# Patient Record
Sex: Male | Born: 1975 | Race: White | Marital: Married | Smoking: Former smoker
Health system: Southern US, Community
[De-identification: ages and names within clinical notes are randomized; demographics above are authoritative.]

## PROBLEM LIST (undated history)

## (undated) DIAGNOSIS — R079 Chest pain, unspecified: Secondary | ICD-10-CM

## (undated) DIAGNOSIS — I10 Essential (primary) hypertension: Secondary | ICD-10-CM

## (undated) DIAGNOSIS — E785 Hyperlipidemia, unspecified: Secondary | ICD-10-CM

## (undated) HISTORY — DX: Essential (primary) hypertension: I10

## (undated) HISTORY — DX: Hyperlipidemia, unspecified: E78.5

## (undated) HISTORY — DX: Chest pain, unspecified: R07.9

---
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
## 2009-10-09 HISTORY — PX: NASAL/SINUS ENDOSCOPY: SHX288

## 2014-03-30 ENCOUNTER — Ambulatory Visit
Admission: RE | Admit: 2014-03-30 | Discharge: 2014-03-30 | Disposition: A | Discharge status: Home / Self Care | Payer: BC Managed Care – PPO | Source: Ambulatory Visit | Attending: Family Medicine | Admitting: Family Medicine

## 2014-03-30 ENCOUNTER — Other Ambulatory Visit (HOSPITAL_COMMUNITY): Payer: Self-pay | Admitting: Family Medicine

## 2014-03-30 DIAGNOSIS — M25551 Pain in right hip: Secondary | ICD-10-CM

## 2015-01-11 ENCOUNTER — Telehealth: Payer: Self-pay | Admitting: Cardiovascular Disease

## 2015-01-11 NOTE — Telephone Encounter (Signed)
Received records from [facility] for appointment on 01/29/15.  [facility] for Dr Hazle CocaBerry's schedule on 01/29/15. lp

## 2015-01-29 ENCOUNTER — Encounter: Payer: Self-pay | Admitting: Cardiovascular Disease

## 2015-01-29 ENCOUNTER — Ambulatory Visit (INDEPENDENT_AMBULATORY_CARE_PROVIDER_SITE_OTHER): Payer: BLUE CROSS/BLUE SHIELD | Admitting: Cardiovascular Disease

## 2015-01-29 VITALS — BP 120/74 | HR 73 | Ht 76.0 in | Wt 223.7 lb

## 2015-01-29 DIAGNOSIS — Z79899 Other long term (current) drug therapy: Secondary | ICD-10-CM

## 2015-01-29 DIAGNOSIS — R079 Chest pain, unspecified: Secondary | ICD-10-CM

## 2015-01-29 DIAGNOSIS — E785 Hyperlipidemia, unspecified: Secondary | ICD-10-CM

## 2015-01-29 DIAGNOSIS — E781 Pure hyperglyceridemia: Secondary | ICD-10-CM | POA: Insufficient documentation

## 2015-01-29 DIAGNOSIS — R0789 Other chest pain: Secondary | ICD-10-CM | POA: Insufficient documentation

## 2015-01-29 NOTE — Assessment & Plan Note (Signed)
History of mild hyperlipidemia with his last LDL measured 2 years ago 110. Does have mild hypercholesterolemia part probably related to alcohol use (3 beers a day). Given his family history of heart disease undergoing to get a NMR lipid med lipid profile to further evaluate his particle size and number and therefore cardiovascular risk.

## 2015-01-29 NOTE — Progress Notes (Signed)
     01/29/2015 Marvin Chan   27-Jul-1976  [id number]  Primary Physician Pcp Not In System Primary Cardiologist: Runell GessJonathan J. Berry MD Roseanne RenoFACP,FACC,FAHA, FSCAI   HPI:  Marvin Chan is a delightful 39 year old mildly overweight married Caucasian male father of one 41-year-old daughter who is referred by Dr. Ronal Fearichard Keever Adson Gentile for cardiovascular evaluation because of recent onset of chest pain. 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 His cardiovascular risk factor profile is only notable for mild hyperlipidemia but otherwise is negative. He does have several uncles who have had ischemic heart disease but no first-degree relative. He recently developed atypical chest pain while riding his bicycle. He is an avid bicyclist and has had some chest pain since that time without radiation or associated symptoms.   No current outpatient prescriptions on file.   No current facility-administered medications for this visit.    No Known Allergies  History   Social History  . Marital Status: Married    Spouse Name: N/A  . Number of Children: N/A  . Years of Education: N/A   Occupational History  . Not on file.   Social History Main Topics  . Smoking status: Never Smoker   . Smokeless tobacco: Never Used  . Alcohol Use: Not on file  . Drug Use: Not on file  . Sexual Activity: Not on file   Other Topics Concern  . Not on file   Social History Narrative  . No narrative on file     Review of Systems: General: negative for chills, fever, night sweats or weight changes.  Cardiovascular: negative for chest pain, dyspnea on exertion, edema, orthopnea, palpitations, paroxysmal nocturnal dyspnea or shortness of breath Dermatological: negative for rash Respiratory: negative for cough or wheezing Urologic: negative for hematuria Abdominal: negative for nausea, vomiting, diarrhea, bright red blood per rectum, melena, or hematemesis Neurologic: negative for visual changes, syncope, or dizziness All  other systems reviewed and are otherwise negative except as noted above.    Blood pressure 120/74, pulse 73, height 6\' 4"  (1.93 m), weight 223 lb 11.2 oz (101.47 kg).  General appearance: alert and no distress Neck: no adenopathy, no carotid bruit, no JVD, supple, symmetrical, trachea midline and thyroid not enlarged, symmetric, no tenderness/mass/nodules Lungs: clear to auscultation bilaterally Heart: regular rate and rhythm, S1, S2 normal, no murmur, click, rub or gallop Extremities: extremities normal, atraumatic, no cyanosis or edema and 2+ pedal pulses bilaterally  EKG normal sinus rhythm at 73 without ST or T-wave changes. I personally reviewed this EKG  ASSESSMENT AND PLAN:   Hyperlipidemia History of mild hyperlipidemia with his last LDL measured 2 years ago 110. Does have mild hypercholesterolemia part probably related to alcohol use (3 beers a day). Given his family history of heart disease undergoing to get a NMR lipid med lipid profile to further evaluate his particle size and number and therefore cardiovascular risk.   Chest pain Marvin Chan recently developed some atypical chest pain while riding his bicycle. He is an avid bicyclist and has had some off-and-on chest pain since. He's had uncles with CAD ischemic heart disease. I'm going to get a routine GXT to further evaluate him and risk stratify him       Runell GessJonathan J. Berry MD [facility] HospitalFACP,FACC,FAHA, [facility]ayerFSCAI 01/29/2015 4:00 PM

## 2015-01-29 NOTE — Assessment & Plan Note (Signed)
Marvin Chan recently developed some atypical chest pain while riding his bicycle. He is an avid bicyclist and has had some off-and-on chest pain since. He's had uncles with CAD ischemic heart disease. I'm going to get a routine GXT to further evaluate him and risk stratify him

## 2015-01-29 NOTE — Patient Instructions (Signed)
  We will see you back in follow up after the test.  Dr Allyson SabalBerry has ordered: 1. Your physician has requested that you have an exercise tolerance test. For further information please visit [email or web address]. Please also follow instruction sheet, as given.  2. A FASTING lipid profile: to be done at your convenience.  [facility] is [facility] lab on the first floor of this building, [address].  They are open from 8am-5pm with a lunch from 12-2.  You do not need an appointment.

## 2015-02-03 ENCOUNTER — Other Ambulatory Visit: Payer: Self-pay | Admitting: *Deleted

## 2015-02-03 DIAGNOSIS — R079 Chest pain, unspecified: Secondary | ICD-10-CM

## 2015-02-19 ENCOUNTER — Telehealth (HOSPITAL_COMMUNITY): Payer: Self-pay

## 2015-02-19 NOTE — Telephone Encounter (Signed)
Encounter complete. 

## 2015-02-23 ENCOUNTER — Telehealth (HOSPITAL_COMMUNITY): Payer: Self-pay | Admitting: *Deleted

## 2015-02-23 NOTE — Telephone Encounter (Signed)
Returned patient's call regarding upcoming GXT, instructions given via voice mail. Antionette CharMary J Tahirah Sara, RN

## 2015-02-24 ENCOUNTER — Ambulatory Visit (HOSPITAL_COMMUNITY)
Admission: RE | Admit: 2015-02-24 | Discharge: 2015-02-24 | Disposition: A | Discharge status: Home / Self Care | Payer: BLUE CROSS/BLUE SHIELD | Source: Ambulatory Visit | Attending: Cardiology | Admitting: Cardiology

## 2015-02-24 DIAGNOSIS — R079 Chest pain, unspecified: Secondary | ICD-10-CM | POA: Diagnosis not present

## 2015-02-24 LAB — EXERCISE TOLERANCE TEST
CHL CUP STRESS STAGE 1 GRADE: 0 %
CHL CUP STRESS STAGE 1 SBP: 127 mmHg
CHL CUP STRESS STAGE 1 SPEED: 0 mph
CHL CUP STRESS STAGE 10 DBP: 86 mmHg
CHL CUP STRESS STAGE 10 SBP: 136 mmHg
CHL CUP STRESS STAGE 10 SPEED: 0 mph
CHL CUP STRESS STAGE 2 SPEED: 0.8 mph
CHL CUP STRESS STAGE 3 SPEED: 1 mph
CHL CUP STRESS STAGE 4 GRADE: 10 %
CHL CUP STRESS STAGE 4 HR: 83 {beats}/min
CHL CUP STRESS STAGE 5 DBP: 88 mmHg
CHL CUP STRESS STAGE 5 GRADE: 12 %
CHL CUP STRESS STAGE 5 SBP: 143 mmHg
CHL CUP STRESS STAGE 7 SPEED: 4.1 mph
CHL CUP STRESS STAGE 8 DBP: 40 mmHg
CHL CUP STRESS STAGE 8 SPEED: 5 mph
CHL CUP STRESS STAGE 9 HR: 123 {beats}/min
CHL CUP STRESS STAGE 9 SBP: 169 mmHg
CSEPED: 14 min
CSEPEW: 17.2 METS
Exercise duration (sec): 3 s
MPHR: 180 {beats}/min
Peak BP: 67 mmHg
Peak HR: 176 {beats}/min
Percent HR: 98 %
Percent of predicted max HR: 98 %
RPE: 30272
Rest HR: 63 {beats}/min
Stage 1 DBP: 80 mmHg
Stage 1 HR: 72 {beats}/min
Stage 10 Grade: 0 %
Stage 10 HR: 78 {beats}/min
Stage 2 Grade: 0 %
Stage 2 HR: 72 {beats}/min
Stage 3 Grade: 0.1 %
Stage 3 HR: 72 {beats}/min
Stage 4 DBP: 92 mmHg
Stage 4 SBP: 127 mmHg
Stage 4 Speed: 1.7 mph
Stage 5 HR: 96 {beats}/min
Stage 5 Speed: 2.5 mph
Stage 6 DBP: 82 mmHg
Stage 6 Grade: 14 %
Stage 6 HR: 129 {beats}/min
Stage 6 SBP: 168 mmHg
Stage 6 Speed: 3.4 mph
Stage 7 DBP: 77 mmHg
Stage 7 Grade: 16 %
Stage 7 HR: 160 {beats}/min
Stage 7 SBP: 172 mmHg
Stage 8 Grade: 18 %
Stage 8 HR: 176 {beats}/min
Stage 8 SBP: 67 mmHg
Stage 9 DBP: 70 mmHg
Stage 9 Grade: 0 %
Stage 9 Speed: 0 mph

## 2015-02-25 ENCOUNTER — Telehealth: Payer: Self-pay | Admitting: Cardiovascular Disease

## 2015-02-25 NOTE — Telephone Encounter (Signed)
Patient asking for Xanax so that he won't pass out during blood draw.  Has lipid & liver labs pending draw at his convenience.  Routing to Dr. Allyson SabalBerry to advise.

## 2015-02-25 NOTE — Telephone Encounter (Signed)
Pt called in stating that he is suppose to have some lab work done before coming in for his follow up from his stress test. Problem is , he gets high anxiety about having his blood drawn and then he faints. He would like to know if  he could receive Xanax to get through the blood drawing process. Please call  Thanks

## 2015-02-26 NOTE — Telephone Encounter (Signed)
Called patient to inform that Dr. Allyson SabalBerry would not write for Xanax, no answer on pickup, no VM box.

## 2015-02-26 NOTE — Telephone Encounter (Signed)
No

## 2015-03-17 ENCOUNTER — Ambulatory Visit: Payer: BLUE CROSS/BLUE SHIELD | Admitting: Cardiovascular Disease

## 2015-03-30 ENCOUNTER — Encounter: Payer: Self-pay | Admitting: Cardiovascular Disease

## 2015-04-06 ENCOUNTER — Ambulatory Visit (INDEPENDENT_AMBULATORY_CARE_PROVIDER_SITE_OTHER): Payer: BLUE CROSS/BLUE SHIELD | Admitting: Cardiovascular Disease

## 2015-04-06 ENCOUNTER — Encounter: Payer: Self-pay | Admitting: Cardiovascular Disease

## 2015-04-06 VITALS — BP 136/72 | HR 72 | Ht 75.5 in | Wt 226.6 lb

## 2015-04-06 DIAGNOSIS — E785 Hyperlipidemia, unspecified: Secondary | ICD-10-CM

## 2015-04-06 DIAGNOSIS — R079 Chest pain, unspecified: Secondary | ICD-10-CM

## 2015-04-06 DIAGNOSIS — Z13220 Encounter for screening for lipoid disorders: Secondary | ICD-10-CM

## 2015-04-06 NOTE — Progress Notes (Signed)
Mr. Marvin Chan returns for follow-up. He was seen for dilation of chest pain. His routine GXT was entirely normal. No has pain. LDL has increased from 90-118 last 4 years probably as a result of diet. Talked about dietary modification which she has agreed to a more attention to. We will recheck a lipid liver profile in 3 months I will see back when necessary   Runell GessJonathan J. Jye Fariss, M.D., FACP, [facility]CFACC, Earl LagosFAHA, [facility]SCAI [facility] [address] [place], [place]  [address]  [phone] 04/06/2015 9:56 AM

## 2015-04-06 NOTE — Patient Instructions (Signed)
Your physician recommends that you return for lab work in 3 months - FASTING.  Dr Allyson SabalBerry recommends that you follow-up with him as needed.

## 2015-04-06 NOTE — Assessment & Plan Note (Signed)
Mr. Marvin Chan had a routine treadmill performed last month that was entirely normal. He no longer has chest pain. I reassured him the likelihood of his pain being ischemic in nature is extremely small

## 2015-04-06 NOTE — Assessment & Plan Note (Signed)
Recent lipid profile performed at his place of work revealed total cholesterol of 202, LDL 118, HDL of 50 and triglyceride level of 166. He does amended to drinking beer and eating excess amount of red meat. Talked about dietary modification. I'm going to recheck his lipid profile in approximately 3 months.

## 2017-01-31 LAB — TSH: TSH: 1.55 (ref 0.41–5.90)

## 2017-01-31 LAB — BASIC METABOLIC PANEL
BUN: 11 (ref 4–21)
Creatinine: 0.9 (ref 0.6–1.3)
Glucose: 92
Potassium: 4.1 (ref 3.4–5.3)
Sodium: 137 (ref 137–147)

## 2017-01-31 LAB — HEPATIC FUNCTION PANEL
ALT: 18 (ref 10–40)
AST: 17 (ref 14–40)
Alkaline Phosphatase: 57 (ref 25–125)
BILIRUBIN, TOTAL: 1.1

## 2017-01-31 LAB — CBC AND DIFFERENTIAL
HEMATOCRIT: 43 (ref 41–53)
Hemoglobin: 15.1 (ref 13.5–17.5)
NEUTROS ABS: 3
Platelets: 224 (ref 150–399)
WBC: 4.6

## 2017-02-01 LAB — LIPID PANEL
Cholesterol: 175 (ref 0–200)
HDL: 41 (ref 35–70)
LDL Cholesterol: 100
Triglycerides: 168 — AB (ref 40–160)

## 2017-06-20 ENCOUNTER — Ambulatory Visit (INDEPENDENT_AMBULATORY_CARE_PROVIDER_SITE_OTHER): Payer: BLUE CROSS/BLUE SHIELD

## 2017-06-20 ENCOUNTER — Ambulatory Visit (INDEPENDENT_AMBULATORY_CARE_PROVIDER_SITE_OTHER): Payer: BLUE CROSS/BLUE SHIELD | Admitting: Orthopedic Surgery

## 2017-06-20 ENCOUNTER — Encounter (INDEPENDENT_AMBULATORY_CARE_PROVIDER_SITE_OTHER): Payer: Self-pay | Admitting: Orthopedic Surgery

## 2017-06-20 DIAGNOSIS — M25561 Pain in right knee: Secondary | ICD-10-CM

## 2017-06-23 NOTE — Progress Notes (Signed)
   Office Visit Note   Patient: Marvin Chan           Date of Birth: Sep 17, 1976           MRN: [id number] Visit Date: 06/20/2017 Requested by: No referring provider defined for this encounter. PCP: System, Pcp Not In  Subjective: Chief Complaint  Patient presents with  . Right Knee - Pain    HPI: Marvin Chan is a 41 year old patient with 2 year history of worsening right knee pain.  He is very active.  States that the knee grinds when he is going up stairs.  He has some pain if he goes for long time.  Denies any swelling and does not take any medication for the problem.  Denies any discrete history of injury.              ROS: All systems reviewed are negative as they relate to the chief complaint within the history of present illness.  Patient denies  fevers or chills.   Assessment & Plan: Visit Diagnoses:  1. Acute pain of right knee     Plan: Impression is right knee asymmetric patellofemoral crepitus without an effusion and normal x-rays.  He is not particular symptomatic from this.  Plan at this time would be first course of treatment has he becomes symptomatic anti-inflammatories followed by injection followed by MRI scanning and intervention if indicated.  Currently no the seizure indicated.  I'll see him back as needed  Follow-Up Instructions: Return if symptoms worsen or fail to improve.   Orders:  Orders Placed This Encounter  Procedures  . XR KNEE 3 VIEW RIGHT   No orders of the defined types were placed in this encounter.     Procedures: No procedures performed   Clinical Data: No additional findings.  Objective: Vital Signs: There were no vitals taken for this visit.  Physical Exam:   Constitutional: Patient appears well-developed HEENT:  Head: Normocephalic Eyes:EOM are normal Neck: Normal range of motion Cardiovascular: Normal rate Pulmonary/chest: Effort normal Neurologic: Patient is alert Skin: Skin is warm Psychiatric: Patient has normal mood and  affect    Ortho Exam: Orthopedic exam demonstrates full active and passive range of motion of the right knee he does have more patellofemoral crepitus on the right than the left but it is not really associated with any periretinacular tenderness.  There is no effusion in the right knee collateral crucial ligaments are stable.  No joint line tenderness is present.  Range of motion is full.  No groin pain with internal/external rotation of the leg.  Specialty Comments:  No specialty comments available.  Imaging: No results found.   PMFS History: Patient Active Problem List   Diagnosis Date Noted  . Hyperlipidemia 01/29/2015  . Chest pain 01/29/2015   Past Medical History:  Diagnosis Date  . Chest pain   . Hyperlipidemia     No family history on file.  No past surgical history on file. Social History   Occupational History  . Not on file.   Social History Main Topics  . Smoking status: Former Games developer  . Smokeless tobacco: Never Used  . Alcohol use Not on file  . Drug use: Unknown  . Sexual activity: Not on file

## 2017-12-28 DIAGNOSIS — L309 Dermatitis, unspecified: Secondary | ICD-10-CM | POA: Diagnosis not present

## 2018-01-24 ENCOUNTER — Telehealth: Payer: Self-pay | Admitting: Internal Medicine

## 2018-01-24 NOTE — Telephone Encounter (Signed)
Copied from CRM [id number]. Topic: Appointment Scheduling - Prior Auth Required for Appointment >> Jan 24, 2018 10:54 AM Jolayne Hainesaylor, Brittany L wrote: Patient is wanting to know if can be approved to be taken on as a new patient. He said his Wife is a patient of hers Marvin Chan( Marvin Chan ) birthday is [birth year]. Please advise.

## 2018-01-24 NOTE — Telephone Encounter (Signed)
Fine, no more than 1 new patient per day 

## 2018-02-01 NOTE — Telephone Encounter (Signed)
None of the phone numbers In patients chart work. Cannot reach patient to contact to inform

## 2018-03-22 ENCOUNTER — Telehealth: Payer: Self-pay | Admitting: Internal Medicine

## 2018-03-22 NOTE — Telephone Encounter (Signed)
Dr. Okey Duprerawford has accepted this patient as a new patient Ok to schedule .

## 2018-03-24 NOTE — Progress Notes (Signed)
Marvin Chan D.O. [facility] [address] [place], [place] [address] Phone: [phone] Subjective:      CC: Ankle pain follow-up  BJY:NWGNFAOZHYHPI:Subjective  Marvin Chan is a 42 y.o. male coming in with complaint of right ankle pain. Thinks he rolled his ankle running. Inversion.   Onset- 25th of April  Location- Lateral foot/ankle  Character- Sore "pull a muscle sore" Aggravating factors- running Therapies tried-patient states that has changed shoes was made may be some mild improvement.  Will decrease the amount of running again as well. Severity-6 out of 10 but seems to be improving     Past Medical History:  Diagnosis Date  . Chest pain   . Hyperlipidemia    No past surgical history on file. Social History   Socioeconomic History  . Marital status: Married    Spouse name: Not on file  . Number of children: Not on file  . Years of education: Not on file  . Highest education level: Not on file  Occupational History  . Not on file  Social Needs  . Financial resource strain: Not on file  . Food insecurity:    Worry: Not on file    Inability: Not on file  . Transportation needs:    Medical: Not on file    Non-medical: Not on file  Tobacco Use  . Smoking status: Former Games developermoker  . Smokeless tobacco: Never Used  Substance and Sexual Activity  . Alcohol use: Not on file  . Drug use: Not on file  . Sexual activity: Not on file  Lifestyle  . Physical activity:    Days per week: Not on file    Minutes per session: Not on file  . Stress: Not on file  Relationships  . Social connections:    Talks on phone: Not on file    Gets together: Not on file    Attends religious service: Not on file    Active member of club or organization: Not on file    Attends meetings of clubs or organizations: Not on file    Relationship status: Not on file  Other Topics Concern  . Not on file  Social History Narrative  . Not on file   No Known Allergies No family history on  file.   Past medical history, social, surgical and family history all reviewed in electronic medical record.  No pertanent information unless stated regarding to the chief complaint.   Review of Systems:Review of systems updated and as accurate as of 03/24/18  No headache, visual changes, nausea, vomiting, diarrhea, constipation, dizziness, abdominal pain, skin rash, fevers, chills, night sweats, weight loss, swollen lymph nodes, body aches, joint swelling, , chest pain, shortness of breath, mood changes.  Positive muscle aches  Objective  There were no vitals taken for this visit. Systems examined below as of 03/24/18   General: No apparent distress alert and oriented x3 mood and affect normal, dressed appropriately.  HEENT: Pupils equal, extraocular movements intact  Respiratory: Patient's speak in full sentences and does not appear short of breath  Cardiovascular: No lower extremity edema, non tender, no erythema  Skin: Warm dry intact with no signs of infection or rash on extremities or on axial skeleton.  Abdomen: Soft nontender  Neuro: Cranial nerves II through XII are intact, neurovascularly intact in all extremities with 2+ DTRs and 2+ pulses.  Lymph: No lymphadenopathy of posterior or anterior cervical chain or axillae bilaterally.  Gait normal with good balance and coordination.  MSK:  Non tender with full range of motion and good stability and symmetric strength and tone of shoulders, elbows, wrist, hip, knee bilaterally.  Ankle: Right No visible erythema or swelling.  Patient's right foot does sit in a supinated state Range of motion is full in all directions. Strength is 4 out of 5 and symmetric. Stable lateral and medial ligaments; squeeze test and kleiger test unremarkable; Talar dome nontender; No pain at base of 5th MT; No tenderness over cuboid; No tenderness over N spot or navicular prominence Tenderness over the peroneal tendon Mild positive tarsal tunnel  tinel's Able to walk 4 steps. Contralateral ankle unremarkable  MSK US performed of: Right ankle This study was ordered, performed, and interpreted by Terrilee Files D.O.  Foot/Ankle:   All structures visualized.   Talar dome unremarkable  Ankle mortise without effusion. Peroneus longus and brevis tendons show hypoechoic changes and patient does have a small cysts in the intrasubstance of the peroneal brevis Posterior tibialis, flexor hallucis longus, and flexor digitorum longus tendons unremarkable on long and transverse views without sheath effusions. Achilles tendon visualized along length of tendon and unremarkable on long and transverse views without sheath effusion. Anterior Talofibular Ligament and Calcaneofibular Ligaments unremarkable and intact. Power doppler signal normal.  IMPRESSION: Mild peroneal tendinitis with intrasubstance cyst noted  [id number]; 15 additional minutes spent for Therapeutic exercises as stated in above notes.  This included exercises focusing on stretching, strengthening, with significant focus on eccentric aspects.   Long term goals include an improvement in range of motion, strength, endurance as well as avoiding reinjury. Patient's frequency would include in 1-2 times a day, 3-5 times a week for a duration of 6-12 weeks. Ankle strengthening that included:  Basic range of motion exercises to allow proper full motion at ankle Stretching of the lower leg and hamstrings  Theraband exercises for the lower leg - inversion, eversion, dorsiflexion and plantarflexion each to be completed with a theraband which was given  Balance exercises to increase proprioception Weight bearing exercises to increase strength and balance Proper technique shown and discussed handout in great detail with ATC.  All questions were discussed and answered.     Impression and Recommendations:     This case required medical decision making of moderate complexity.      Note: This  dictation was prepared with Dragon dictation along with smaller phrase technology. Any transcriptional errors that result from this process are unintentional.

## 2018-03-25 ENCOUNTER — Ambulatory Visit (INDEPENDENT_AMBULATORY_CARE_PROVIDER_SITE_OTHER): Payer: BLUE CROSS/BLUE SHIELD | Admitting: Family Medicine

## 2018-03-25 ENCOUNTER — Ambulatory Visit: Payer: Self-pay

## 2018-03-25 ENCOUNTER — Encounter: Payer: Self-pay | Admitting: Family Medicine

## 2018-03-25 VITALS — BP 108/70 | HR 53 | Ht 75.5 in | Wt 216.0 lb

## 2018-03-25 DIAGNOSIS — M25571 Pain in right ankle and joints of right foot: Principal | ICD-10-CM

## 2018-03-25 DIAGNOSIS — G8929 Other chronic pain: Secondary | ICD-10-CM | POA: Diagnosis not present

## 2018-03-25 DIAGNOSIS — M7671 Peroneal tendinitis, right leg: Secondary | ICD-10-CM | POA: Diagnosis not present

## 2018-03-25 MED ORDER — DICLOFENAC SODIUM 2 % TD SOLN: 2.0000 g | Freq: Two times a day (BID) | TRANSDERMAL | 3 refills | Status: DC

## 2018-03-25 NOTE — Patient Instructions (Signed)
Good to see you  Peroneal tendonitis.  pennsaid pinkie amount topically 2 times daily as needed.  Ice 20 minutes 2 times daily. Usually after activity and before bed. Exercises 3 times a week.  HOKA arahi 2 Body helix- xxlinked ankle sized medium compression  Over the counter get  Vitamin D 2000 IU daily  Turmeric 500mg  daily  Tart cherry extract any dose at night See me again in 4 weeks

## 2018-03-25 NOTE — Assessment & Plan Note (Signed)
Peroneal tendonitis discussed home exercise given, discussed compression, discussed proper shoes and over-the-counter orthotics.  Discussed icing regimen.  Follow-up again in 4 weeks

## 2018-04-03 ENCOUNTER — Encounter: Payer: Self-pay | Admitting: Internal Medicine

## 2018-04-03 ENCOUNTER — Ambulatory Visit (INDEPENDENT_AMBULATORY_CARE_PROVIDER_SITE_OTHER): Payer: BLUE CROSS/BLUE SHIELD | Admitting: Internal Medicine

## 2018-04-03 VITALS — BP 116/78 | HR 55 | Temp 98.4°F | Ht 75.5 in | Wt 213.0 lb

## 2018-04-03 DIAGNOSIS — Z0000 Encounter for general adult medical examination without abnormal findings: Secondary | ICD-10-CM

## 2018-04-03 DIAGNOSIS — Z23 Encounter for immunization: Secondary | ICD-10-CM

## 2018-04-03 DIAGNOSIS — E781 Pure hyperglyceridemia: Secondary | ICD-10-CM | POA: Diagnosis not present

## 2018-04-03 NOTE — Assessment & Plan Note (Signed)
Last LDL 100 reviewed at visit. Has recently stopped drinking alcohol which should help with past triglyceride 160. Diet controlled.

## 2018-04-03 NOTE — Progress Notes (Signed)
   Subjective:    Patient ID: Marvin Chan, male    DOB: November 24, 1975, 42 y.o.   MRN: [id number]  HPI The patient is a new 42 YO man coming in for physical. No concerns. No medications.   PMH, [facility], social history reviewed and updated.   Review of Systems  Constitutional: Negative.   HENT: Negative.   Eyes: Negative.   Respiratory: Negative for cough, chest tightness and shortness of breath.   Cardiovascular: Negative for chest pain, palpitations and leg swelling.  Gastrointestinal: Negative for abdominal distention, abdominal pain, constipation, diarrhea, nausea and vomiting.  Musculoskeletal: Negative.   Skin: Negative.   Neurological: Negative.   Psychiatric/Behavioral: Negative.       Objective:   Physical Exam  Constitutional: He is oriented to person, place, and time. He appears well-developed and well-nourished.  HENT:  Head: Normocephalic and atraumatic.  Eyes: EOM are normal.  Neck: Normal range of motion.  Cardiovascular: Normal rate and regular rhythm.  Pulmonary/Chest: Effort normal and breath sounds normal. No respiratory distress. He has no wheezes. He has no rales.  Abdominal: Soft. Bowel sounds are normal. He exhibits no distension. There is no tenderness. There is no rebound.  Musculoskeletal: He exhibits no edema.  Neurological: He is alert and oriented to person, place, and time. Coordination normal.  Skin: Skin is warm and dry.  Psychiatric: He has a normal mood and affect.   Vitals:   04/03/18 0859  BP: 116/78  Pulse: (!) 55  Temp: 98.4 F (36.9 C)  TempSrc: Oral  SpO2: 99%  Weight: 213 lb (96.6 kg)  Height: 6' 3.5" (1.918 m)      Assessment & Plan:  Tdap given at visit.

## 2018-04-03 NOTE — Patient Instructions (Signed)

## 2018-04-03 NOTE — Assessment & Plan Note (Signed)
Tdap given at visit. Does yearly flu shot. Exercises and non-smoker. Counseled about hearing protection, sun safety and dangers of distracted driving. Given screening recommendations.

## 2018-04-03 NOTE — Progress Notes (Signed)
Abstracted and sent to scan  

## 2018-04-24 ENCOUNTER — Ambulatory Visit: Payer: BLUE CROSS/BLUE SHIELD | Admitting: Family Medicine

## 2018-08-25 NOTE — Progress Notes (Signed)
[facility] [address] [place], [place] [address] Phone: [phone] Subjective:   Bruce Donath, am serving as a scribe for Dr. Antoine Primas.   CC: Right foot pain  BJY:[id number]  Marvin Chan is a 42 y.o. male coming in with complaint of right foot pain. Pain over the lateral aspect of foot over base of the 5th. Pain with longer runs. Is running a marathon this weekend. Increased his training in November as he didn't train much in October. Has just started Vitamin D. Is running in [employer] shoes with [employer].        Past Medical History:  Diagnosis Date  . Chest pain   . Hyperlipidemia   . Hypertension    Past Surgical History:  Procedure Laterality Date  . NASAL/SINUS ENDOSCOPY  2011  . varicose ligation   1993   Social History   Socioeconomic History  . Marital status: Married    Spouse name: Not on file  . Number of children: Not on file  . Years of education: Not on file  . Highest education level: Not on file  Occupational History  . Not on file  Social Needs  . Financial resource strain: Not on file  . Food insecurity:    Worry: Not on file    Inability: Not on file  . Transportation needs:    Medical: Not on file    Non-medical: Not on file  Tobacco Use  . Smoking status: Former Games developer  . Smokeless tobacco: Never Used  Substance and Sexual Activity  . Alcohol use: Never    Alcohol/week: 0.0 standard drinks    Frequency: Never  . Drug use: Never  . Sexual activity: Yes  Lifestyle  . Physical activity:    Days per week: Not on file    Minutes per session: Not on file  . Stress: Not on file  Relationships  . Social connections:    Talks on phone: Not on file    Gets together: Not on file    Attends religious service: Not on file    Active member of club or organization: Not on file    Attends meetings of clubs or organizations: Not on file    Relationship status: Not on file  Other Topics Concern    . Not on file  Social History Narrative  . Not on file   No Known Allergies Family History  Problem Relation Age of Onset  . Diabetes Father   . Hyperlipidemia Father   . Alcohol abuse Paternal Grandmother   . Early death Paternal Grandfather   . Heart disease Paternal Grandfather          Current Outpatient Medications (Other):  Marland Kitchen  Diclofenac Sodium (PENNSAID) 2 % SOLN, Place 2 g onto the skin 2 (two) times daily.    Past medical history, social, surgical and family history all reviewed in electronic medical record.  No pertanent information unless stated regarding to the chief complaint.   Review of Systems:  No headache, visual changes, nausea, vomiting, diarrhea, constipation, dizziness, abdominal pain, skin rash, fevers, chills, night sweats, weight loss, swollen lymph nodes, body aches, joint swelling, chest pain, shortness of breath, mood changes.  Positive muscle aches  Objective  Blood pressure 102/66, pulse 74, height 6' 3.5" (1.918 m), weight 213 lb (96.6 kg), SpO2 98 %.    General: No apparent distress alert and oriented x3 mood and affect normal, dressed appropriately.  HEENT: Pupils equal,  extraocular movements intact  Respiratory: Patient's speak in full sentences and does not appear short of breath  Cardiovascular: No lower extremity edema, non tender, no erythema  Skin: Warm dry intact with no signs of infection or rash on extremities or on axial skeleton.  Abdomen: Soft nontender  Neuro: Cranial nerves II through XII are intact, neurovascularly intact in all extremities with 2+ DTRs and 2+ pulses.  Lymph: No lymphadenopathy of posterior or anterior cervical chain or axillae bilaterally.  Gait normal with good balance and coordination.  MSK:  Non tender with full range of motion and good stability and symmetric strength and tone of shoulders, elbows, wrist, hip, knee bilaterally.  Ankle: Right Swelling noted Range of motion is full in all  directions. Strength is 5/5 in all directions. Stable lateral and medial ligaments; squeeze test and kleiger test unremarkable; Talar dome nontender; Peroneal tendon is some swelling noted.  Does have some limited range of motion. Able to walk 4 steps. Contralateral ankle unremarkable   MSK US performed of: right  This study was ordered, performed, and interpreted by Terrilee FilesZach Smith D.O.  Foot/Ankle:   Patient does have more of a hypoechoic changes.  Does have some swelling noted as well.  IMPRESSION: Intrasubstance tearing noted with significant swelling Peroneal tendon some abnormal vascularity   Impression and Recommendations:     This case required medical decision making of moderate complexity. The above documentation has been reviewed and is accurate and complete Judi SaaZachary M Smith, DO       Note: This dictation was prepared with Dragon dictation along with smaller phrase technology. Any transcriptional errors that result from this process are unintentional.

## 2018-08-26 ENCOUNTER — Ambulatory Visit (INDEPENDENT_AMBULATORY_CARE_PROVIDER_SITE_OTHER)
Admission: RE | Admit: 2018-08-26 | Discharge: 2018-08-26 | Disposition: A | Discharge status: Home / Self Care | Payer: BLUE CROSS/BLUE SHIELD | Source: Ambulatory Visit | Attending: Family Medicine | Admitting: Family Medicine

## 2018-08-26 ENCOUNTER — Ambulatory Visit: Payer: BLUE CROSS/BLUE SHIELD | Admitting: Family Medicine

## 2018-08-26 ENCOUNTER — Other Ambulatory Visit: Payer: Self-pay

## 2018-08-26 ENCOUNTER — Encounter: Payer: Self-pay | Admitting: Family Medicine

## 2018-08-26 ENCOUNTER — Ambulatory Visit: Payer: Self-pay

## 2018-08-26 VITALS — BP 102/66 | HR 74 | Ht 75.5 in | Wt 213.0 lb

## 2018-08-26 DIAGNOSIS — M79671 Pain in right foot: Secondary | ICD-10-CM | POA: Diagnosis not present

## 2018-08-26 DIAGNOSIS — M7671 Peroneal tendinitis, right leg: Secondary | ICD-10-CM

## 2018-08-26 DIAGNOSIS — M25571 Pain in right ankle and joints of right foot: Secondary | ICD-10-CM | POA: Diagnosis not present

## 2018-08-26 MED ORDER — DICLOFENAC SODIUM 2 % TD SOLN: 2.0000 g | Freq: Two times a day (BID) | TRANSDERMAL | 3 refills | Status: DC

## 2018-08-26 NOTE — Patient Instructions (Signed)
Good to see you  No change otherwise  Xray downstairs today  [employer] orthotics "total support" online would be great  Ice 20 minutes 2 times daily. Usually after activity and before bed. pennsaid pinkie amount topically 2 times daily as needed.  Vitamin D 4000 Iu daily  Biking until the race  See me again in 5634- weeks and we will make sure the swelling comes down and we do not need to drain it or get an MRI

## 2018-08-26 NOTE — Assessment & Plan Note (Signed)
Severe at this time.  Worsening symptoms.  Discussed compression, worsening symptoms consider injection.  X-rays ordered today to to further evaluate.  Icing regimen.  Follow-up again in 4 to 6 weeks

## 2018-08-31 ENCOUNTER — Encounter: Payer: Self-pay | Admitting: Family Medicine

## 2018-10-10 NOTE — Progress Notes (Signed)
[facility]aneen [facility] D.O. Marvin Chan [address] [place], [place] [address] Phone: [phone] Subjective:   Marvin Chan, [name], am serving as a scribe for Dr. Antoine PrimasZachary Chantay Whitelock.   CC: Ankle pain follow-up  HYQ:[id number]:Subjective  Marvin MallJohn Chan is a 43 y.o. male coming in with complaint of peroneal tendonitis of the right foot. Patient states that he was able to complete his race in November. Has not ran for past month. Did roll his ankle 2 weeks ago on a hike. States that he is feeling much better though.      Past Medical History:  Diagnosis Date  . Chest pain   . Hyperlipidemia   . Hypertension    Past Surgical History:  Procedure Laterality Date  . NASAL/SINUS ENDOSCOPY  2011  . varicose ligation   1993   Social History   Socioeconomic History  . Marital status: Married    Spouse name: Not on file  . Number of children: Not on file  . Years of education: Not on file  . Highest education level: Not on file  Occupational History  . Not on file  Social Needs  . Financial resource strain: Not on file  . Food insecurity:    Worry: Not on file    Inability: Not on file  . Transportation needs:    Medical: Not on file    Non-medical: Not on file  Tobacco Use  . Smoking status: Former Games developermoker  . Smokeless tobacco: Never Used  Substance and Sexual Activity  . Alcohol use: Never    Alcohol/week: 0.0 standard drinks    Frequency: Never  . Drug use: Never  . Sexual activity: Yes  Lifestyle  . Physical activity:    Days per week: Not on file    Minutes per session: Not on file  . Stress: Not on file  Relationships  . Social connections:    Talks on phone: Not on file    Gets together: Not on file    Attends religious service: Not on file    Active member of club or organization: Not on file    Attends meetings of clubs or organizations: Not on file    Relationship status: Not on file  Other Topics Concern  . Not on file  Social History Narrative  . Not on file   No  Known Allergies Family History  Problem Relation Age of Onset  . Diabetes Father   . Hyperlipidemia Father   . Alcohol abuse Paternal Grandmother   . Early death Paternal Grandfather   . Heart disease Paternal Grandfather          Current Outpatient Medications (Other):  Marland Kitchen.  Diclofenac Sodium (PENNSAID) 2 % SOLN, Place 2 g onto the skin 2 (two) times daily.    Past medical history, social, surgical and family history all reviewed in electronic medical record.  No pertanent information unless stated regarding to the chief complaint.   Review of Systems:  No headache, visual changes, nausea, vomiting, diarrhea, constipation, dizziness, abdominal pain, skin rash, fevers, chills, night sweats, weight loss, swollen lymph nodes, body aches, joint swelling, muscle aches, chest pain, shortness of breath, mood changes.   Objective  There were no vitals taken for this visit. Systems examined below as of    General: No apparent distress alert and oriented x3 mood and affect normal, dressed appropriately.  HEENT: Pupils equal, extraocular movements intact  Respiratory: Patient's speak in full sentences and does not appear short of breath  Cardiovascular: No lower extremity edema, non tender, no erythema  Skin: Warm dry intact with no signs of infection or rash on extremities or on axial skeleton.  Abdomen: Soft nontender  Neuro: Cranial nerves II through XII are intact, neurovascularly intact in all extremities with 2+ DTRs and 2+ pulses.  Lymph: No lymphadenopathy of posterior or anterior cervical chain or axillae bilaterally.  Gait normal with good balance and coordination.  MSK:  Non tender with full range of motion and good stability and symmetric strength and tone of shoulders, elbows, wrist, hip, knee bilaterally.  Ankle:right  No visible erythema. Range of motion is full in all directions. Strength is 5/5 in all directions. Stable lateral and medial ligaments; squeeze test and  kleiger test unremarkable; Talar dome nontender; No pain at base of 5th MT; No tenderness over cuboid; No tenderness over N spot or navicular prominence No tenderness on posterior aspects of lateral and medial malleolus Swelling of the peroneal tendon noted Negative tarsal tunnel tinel's Able to walk 4 steps. Contralateral ankle unremarkable  Procedure: Real-time Ultrasound Guided Injection of right peroneal tendon sheath Device: GE Logiq Q7 Ultrasound guided injection is preferred based studies that show increased duration, increased effect, greater accuracy, decreased procedural pain, increased response rate, and decreased cost with ultrasound guided versus blind injection.  Verbal informed consent obtained.  Time-out conducted.  Noted no overlying erythema, induration, or other signs of local infection.  Skin prepped in a sterile fashion.  Local anesthesia: Topical Ethyl chloride.  With sterile technique and under real time ultrasound guidance: With an 18-gauge 1-1/2 inch needle patient was injected with 1 cc of 0.5% Marcaine and then had aspiration of gel-like material.  Injected 0.5 cc of Kenalog 40 mg/mL Completed without difficulty  Pain immediately resolved suggesting accurate placement of the medication.  Advised to call if fevers/chills, erythema, induration, drainage, or persistent bleeding.  Images permanently stored and available for review in the ultrasound unit.  Impression: Technically successful ultrasound guided injection.   Impression and Recommendations:     This case required medical decision making of moderate complexity. The above documentation has been reviewed and is accurate and complete Judi SaaZachary M Amarra Sawyer, DO       Note: This dictation was prepared with Dragon dictation along with smaller phrase technology. Any transcriptional errors that result from this process are unintentional.

## 2018-10-11 ENCOUNTER — Ambulatory Visit: Payer: Self-pay

## 2018-10-11 ENCOUNTER — Encounter: Payer: Self-pay | Admitting: Family Medicine

## 2018-10-11 ENCOUNTER — Ambulatory Visit: Payer: BLUE CROSS/BLUE SHIELD | Admitting: Family Medicine

## 2018-10-11 VITALS — BP 120/84 | HR 58 | Ht 75.5 in | Wt 215.0 lb

## 2018-10-11 DIAGNOSIS — G8929 Other chronic pain: Secondary | ICD-10-CM

## 2018-10-11 DIAGNOSIS — M25571 Pain in right ankle and joints of right foot: Secondary | ICD-10-CM | POA: Diagnosis not present

## 2018-10-11 DIAGNOSIS — M7671 Peroneal tendinitis, right leg: Secondary | ICD-10-CM

## 2018-10-11 NOTE — Patient Instructions (Signed)
Good to see you  Ice is your friend Aspirated the cyst today and should do a lot better  Give the bike another month  Maybe one more follow up and then we weill make sure you are good to go IF you want to run

## 2018-10-11 NOTE — Assessment & Plan Note (Signed)
Aspiration noted.  Discussed icing regimen and home exercise.  Which activities of doing which wants to avoid.  Patient will do more compression and icing regimen.  Discussed topical anti-inflammatories.  Follow-up again for an 8 weeks.

## 2019-03-24 ENCOUNTER — Encounter: Payer: Self-pay | Admitting: Internal Medicine

## 2019-04-03 DIAGNOSIS — L820 Inflamed seborrheic keratosis: Secondary | ICD-10-CM | POA: Diagnosis not present

## 2019-04-03 DIAGNOSIS — L723 Sebaceous cyst: Secondary | ICD-10-CM | POA: Diagnosis not present

## 2019-07-25 ENCOUNTER — Ambulatory Visit (INDEPENDENT_AMBULATORY_CARE_PROVIDER_SITE_OTHER): Payer: BC Managed Care – PPO

## 2019-07-25 ENCOUNTER — Other Ambulatory Visit: Payer: Self-pay

## 2019-07-25 DIAGNOSIS — Z23 Encounter for immunization: Secondary | ICD-10-CM | POA: Diagnosis not present

## 2019-09-23 ENCOUNTER — Encounter: Payer: Self-pay | Admitting: Family Medicine

## 2019-10-20 ENCOUNTER — Encounter: Payer: Self-pay | Admitting: Internal Medicine

## 2019-10-22 ENCOUNTER — Ambulatory Visit: Payer: BC Managed Care – PPO | Attending: Internal Medicine

## 2019-10-22 DIAGNOSIS — Z20822 Contact with and (suspected) exposure to covid-19: Secondary | ICD-10-CM

## 2019-10-24 ENCOUNTER — Encounter: Payer: Self-pay | Admitting: Internal Medicine

## 2019-10-24 LAB — NOVEL CORONAVIRUS, NAA: SARS-CoV-2, NAA: DETECTED — AB

## 2019-10-29 ENCOUNTER — Other Ambulatory Visit: Payer: Self-pay

## 2019-10-29 ENCOUNTER — Emergency Department (HOSPITAL_BASED_OUTPATIENT_CLINIC_OR_DEPARTMENT_OTHER)
Admission: EM | Admit: 2019-10-29 | Discharge: 2019-10-29 | Disposition: A | Discharge status: Home / Self Care | Payer: BC Managed Care – PPO | Attending: Emergency Medicine | Admitting: Emergency Medicine

## 2019-10-29 ENCOUNTER — Emergency Department (HOSPITAL_BASED_OUTPATIENT_CLINIC_OR_DEPARTMENT_OTHER): Payer: BC Managed Care – PPO

## 2019-10-29 ENCOUNTER — Encounter (HOSPITAL_BASED_OUTPATIENT_CLINIC_OR_DEPARTMENT_OTHER): Payer: Self-pay | Admitting: Emergency Medicine

## 2019-10-29 DIAGNOSIS — U071 COVID-19: Secondary | ICD-10-CM

## 2019-10-29 DIAGNOSIS — R079 Chest pain, unspecified: Secondary | ICD-10-CM | POA: Diagnosis not present

## 2019-10-29 DIAGNOSIS — R509 Fever, unspecified: Secondary | ICD-10-CM | POA: Diagnosis not present

## 2019-10-29 DIAGNOSIS — Z79899 Other long term (current) drug therapy: Secondary | ICD-10-CM | POA: Insufficient documentation

## 2019-10-29 DIAGNOSIS — R05 Cough: Secondary | ICD-10-CM | POA: Insufficient documentation

## 2019-10-29 DIAGNOSIS — Z87891 Personal history of nicotine dependence: Secondary | ICD-10-CM | POA: Insufficient documentation

## 2019-10-29 NOTE — ED Notes (Signed)
Patient is A&Ox4 at this time.  Patient in no signs of distress.  Please see providers note for complete history and physical exam.  

## 2019-10-29 NOTE — Discharge Instructions (Signed)
Your chest x-ray did not show any signs of abnormalities, such as fluid or pneumonia.  You can take 1 to 2 tablets of Tylenol (350mg -1000mg  depending on the dose) every 6 hours as needed for pain/fever.  Do not exceed 4000 mg of Tylenol daily.  You can take 400-600 mg of ibuprofen every 6 hours additionally as needed for fever or pain if your symptoms are not controlled on Tylenol alone.  Drink plenty of fluids and get plenty of rest.  You can use over-the-counter medications for any other symptoms such as cough, sore throat, nasal congestion.   Use the pulse oximeter to check your oxygen levels.  Anything below 90% is low and requires immediate evaluation in an emergency department.  If you are having worsening shortness of breath and oxygen levels are below 92% that I would recommend further evaluation as well.  Other indications for return to the emergency department include persistent vomiting, loss of consciousness, severe chest pain or shortness of breath.

## 2019-10-29 NOTE — ED Provider Notes (Signed)
[facility] Provider Note   CSN: [id number] Arrival date & time: 10/29/19  1216     History Chief Complaint  Patient presents with  . Fever    Marvin Chan is a 44 y.o. male with history of hyperlipidemia presents for evaluation of acute onset, progressively worsening fever for 10 days.  He reports that he tested positive for Covid on 10/22/2019.  He has had mild mostly nonproductive cough during this time.  Denies chest pain, shortness of breath, abdominal pain, nausea, vomiting, diarrhea.  He has not been taking anything for his symptoms.  Yesterday he noticed his fever increased to 102 F.  He has taken Tylenol with improvement in his fever.  He spoke with his neighbor who is a physician who recommended further evaluation.  He also has a pulse oximeter at home and notes that his oxygen levels have been around 97% on room air.  The history is provided by the patient.       Past Medical History:  Diagnosis Date  . Chest pain   . Hyperlipidemia     Patient Active Problem List   Diagnosis Date Noted  . Routine general medical examination at a health care facility 04/03/2018  . Peroneal tendinitis, right leg 03/25/2018  . Hypertriglyceridemia without hypercholesterolemia 01/29/2015    Past Surgical History:  Procedure Laterality Date  . NASAL/SINUS ENDOSCOPY  2011  . varicose ligation   1993       Family History  Problem Relation Age of Onset  . Diabetes Father   . Hyperlipidemia Father   . Alcohol abuse Paternal Grandmother   . Early death Paternal Grandfather   . Heart disease Paternal Grandfather     Social History   Tobacco Use  . Smoking status: Former Research scientist (life sciences)  . Smokeless tobacco: Never Used  Substance Use Topics  . Alcohol use: Never    Alcohol/week: 0.0 standard drinks  . Drug use: Never    Home Medications Prior to Admission medications   Medication Sig Start Date End Date Taking? Authorizing Provider  Diclofenac  Sodium (PENNSAID) 2 % SOLN Place 2 g onto the skin 2 (two) times daily. 08/26/18   Lyndal Pulley, DO    Allergies    Patient has no known allergies.  Review of Systems   Review of Systems  Constitutional: Positive for fever.  Respiratory: Positive for cough. Negative for shortness of breath.   Cardiovascular: Negative for chest pain.  Gastrointestinal: Negative for abdominal pain, nausea and vomiting.  All other systems reviewed and are negative.   Physical Exam Updated Vital Signs BP 126/88 (BP Location: Right Arm)   Pulse 88   Temp 99.7 F (37.6 C) (Oral)   Resp 15   SpO2 100%   Physical Exam Vitals and nursing note reviewed.  Constitutional:      General: He is not in acute distress.    Appearance: He is well-developed.  HENT:     Head: Normocephalic and atraumatic.  Eyes:     General:        Right eye: No discharge.        Left eye: No discharge.     Conjunctiva/sclera: Conjunctivae normal.  Neck:     Vascular: No JVD.     Trachea: No tracheal deviation.  Cardiovascular:     Rate and Rhythm: Normal rate and regular rhythm.  Pulmonary:     Effort: Pulmonary effort is normal.     Breath sounds: Normal breath sounds.  Comments: Speaking in full sentences without difficulty, SPO2 saturations 100% on room air. Abdominal:     General: Abdomen is flat. There is no distension.     Palpations: Abdomen is soft.     Tenderness: There is no abdominal tenderness.  Musculoskeletal:     Cervical back: Neck supple.  Skin:    General: Skin is warm and dry.     Findings: No erythema.  Neurological:     Mental Status: He is alert.  Psychiatric:        Behavior: Behavior normal.     ED Results / Procedures / Treatments   Labs (all labs ordered are listed, but only abnormal results are displayed) Labs Reviewed - No data to display  EKG None  Radiology DG Chest Portable 1 View  Result Date: 10/29/2019 CLINICAL DATA:  COVID-19 positive, chest pain EXAM:  PORTABLE CHEST 1 VIEW COMPARISON:  None. FINDINGS: The heart size and mediastinal contours are within normal limits. No focal airspace consolidation, pleural effusion, or pneumothorax. The visualized skeletal structures are unremarkable. IMPRESSION: No active disease. Electronically Signed   By: Duanne Guess D.O.   On: 10/29/2019 13:49    Procedures Procedures (including critical care time)  Medications Ordered in ED Medications - No data to display  ED Course  I have reviewed the triage vital signs and the nursing notes.  Pertinent labs & imaging results that were available during my care of the patient were reviewed by me and considered in my medical decision making (see chart for details).    MDM Rules/Calculators/A&P                      Cavion Faiola was evaluated in Emergency Department on 10/29/2019 for the symptoms described in the history of present illness. He was evaluated in the context of the global COVID-19 pandemic, which necessitated consideration that the patient might be at risk for infection with the SARS-CoV-2 virus that causes COVID-19. Institutional protocols and algorithms that pertain to the evaluation of patients at risk for COVID-19 are in a state of rapid change based on information released by regulatory bodies including the CDC and federal and state organizations. These policies and algorithms were followed during the patient's care in the ED.  Patient with known Covid presents for worsening fever.  Reports his temperature has been around 101F or below but he spiked a fever of 102 F yesterday.  This has been controlled with Tylenol and in the ED he is afebrile and vital signs are stable.  He is nontoxic in appearance.  He denies shortness of breath and he shows no signs of respiratory distress on exam today.  SPO2 saturations have been within normal limits while in the ED.  His chest x-ray shows no acute cardiopulmonary abnormalities with no edema or consolidation.   Doubt PE.  He has a pulse oximeter at home and has been checking his oxygen saturations.  Encourage the patient to take Tylenol as needed for fever, discussed symptomatic management.  Discussed indications for return to the ED and recommend close PCP follow-up. Patient verbalized understanding of and agreement with plan and is safe for discharge home at this time.  Final Clinical Impression(s) / ED Diagnoses Final diagnoses:  COVID-19  Fever in adult    Rx / DC Orders ED Discharge Orders    None       Jeanie Sewer, PA-C 10/29/19 1448    Arby Barrette, MD 10/30/19 [phone]

## 2019-10-31 ENCOUNTER — Ambulatory Visit: Payer: BC Managed Care – PPO | Admitting: Internal Medicine

## 2020-02-24 ENCOUNTER — Encounter: Payer: Self-pay | Admitting: Internal Medicine

## 2020-04-07 IMAGING — DX DG CHEST 1V PORT
1 series · 1 of 1 positions shown · non-contrast
Comparison: None.

CLINICAL DATA: [id number] positive, chest pain

EXAM:
PORTABLE CHEST 1 VIEW

[chest ap]
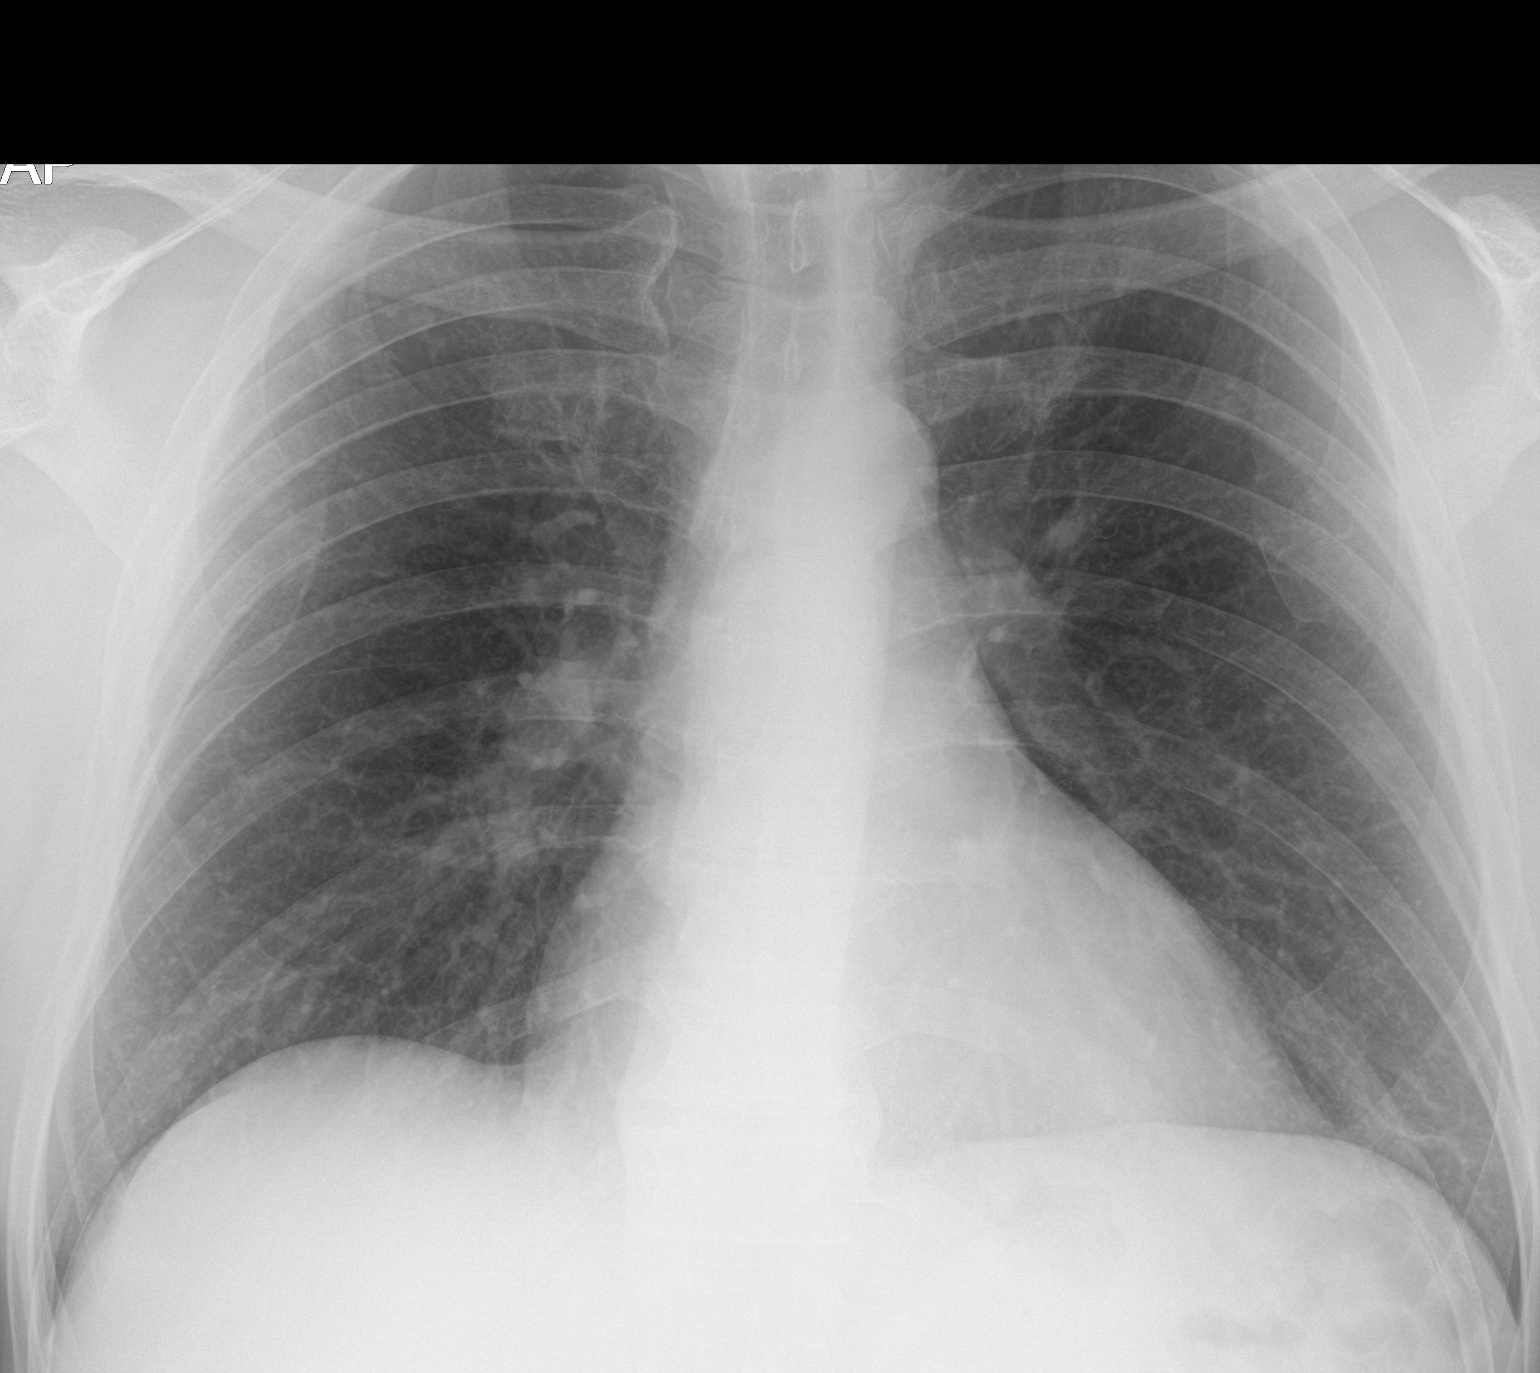

[1 of 1 positions shown; findings below may reference images not displayed]

FINDINGS: The heart size and mediastinal contours are within normal limits. No
focal airspace consolidation, pleural effusion, or pneumothorax. The
visualized skeletal structures are unremarkable.
IMPRESSION: No active disease.

## 2020-04-09 ENCOUNTER — Encounter: Payer: Self-pay | Admitting: Cardiovascular Disease

## 2020-04-09 ENCOUNTER — Telehealth (HOSPITAL_COMMUNITY): Payer: Self-pay | Admitting: Radiology

## 2020-04-09 ENCOUNTER — Ambulatory Visit: Payer: BC Managed Care – PPO | Admitting: Cardiovascular Disease

## 2020-04-09 ENCOUNTER — Other Ambulatory Visit: Payer: Self-pay

## 2020-04-09 DIAGNOSIS — R0789 Other chest pain: Secondary | ICD-10-CM

## 2020-04-09 DIAGNOSIS — E781 Pure hyperglyceridemia: Secondary | ICD-10-CM

## 2020-04-09 DIAGNOSIS — Z8249 Family history of ischemic heart disease and other diseases of the circulatory system: Secondary | ICD-10-CM

## 2020-04-09 LAB — HEPATIC FUNCTION PANEL
ALT: 19 IU/L (ref 0–44)
AST: 16 IU/L (ref 0–40)
Albumin: 4.7 g/dL (ref 4.0–5.0)
Alkaline Phosphatase: 60 IU/L (ref 48–121)
Bilirubin Total: 0.6 mg/dL (ref 0.0–1.2)
Bilirubin, Direct: 0.16 mg/dL (ref 0.00–0.40)
Total Protein: 7.1 g/dL (ref 6.0–8.5)

## 2020-04-09 LAB — LIPID PANEL
Chol/HDL Ratio: 4.6 ratio (ref 0.0–5.0)
Cholesterol, Total: 176 mg/dL (ref 100–199)
HDL: 38 mg/dL — ABNORMAL LOW (ref >39)
LDL Chol Calc (NIH): 113 mg/dL — ABNORMAL HIGH (ref 0–99)
Triglycerides: 141 mg/dL (ref 0–149)
VLDL Cholesterol Cal: 25 mg/dL (ref 5–40)

## 2020-04-09 MED ORDER — METOPROLOL TARTRATE 100 MG PO TABS
ORAL_TABLET | ORAL | 0 refills | Status: DC
Start: 2020-04-09 — End: 2020-10-18

## 2020-04-09 NOTE — Patient Instructions (Signed)
Medication Instructions:  NO CHANGE *If you need a refill on your cardiac medications before your next appointment, please call your pharmacy*   Lab Work: Your physician recommends that you HAVE LAB WORK TODAY If you have labs (blood work) drawn today and your tests are completely normal, you will receive your results only by: Marland Kitchen MyChart Message (if you have MyChart) OR . A paper copy in the mail If you have any lab test that is abnormal or we need to change your treatment, we will call you to review the results.   Testing/Procedures:  Your physician has requested that you have an echocardiogram. Echocardiography is a painless test that uses sound waves to create images of your heart. It provides your doctor with information about the size and shape of your heart and how well your heart's chambers and valves are working. This procedure takes approximately one hour. There are no restrictions for this procedure.[place]  Your cardiac CT will be scheduled at one of the below locations:   [facility] [address] [place], [place] [address] [phone]  If scheduled at [facility], please arrive at the [facility] main entrance of [facility] 30 minutes prior to test start time. Proceed to the [place] [facility] Department (first floor) to check-in and test prep.  Please follow these instructions carefully (unless otherwise directed):  On the Night Before the Test: . Be sure to Drink plenty of water. . Do not consume any caffeinated/decaffeinated beverages or chocolate 12 hours prior to your test. . Do not take any antihistamines 12 hours prior to your test.  On the Day of the Test: . Drink plenty of water. Do not drink any water within one hour of the test. . Do not eat any food 4 hours prior to the test. . You may take your regular medications prior to the test.  . Take metoprolol (Lopressor) 100 MG two hours prior to test. . HOLD  Furosemide/Hydrochlorothiazide morning of the test. . FEMALES- please wear underwire-free bra if available       After the Test: . Drink plenty of water. . After receiving IV contrast, you may experience a mild flushed feeling. This is normal. . On occasion, you may experience a mild rash up to 24 hours after the test. This is not dangerous. If this occurs, you can take Benadryl 25 mg and increase your fluid intake. . If you experience trouble breathing, this can be serious. If it is severe call 911 IMMEDIATELY. If it is mild, please call our office. . If you take any of these medications: Glipizide/Metformin, Avandament, Glucavance, please do not take 48 hours after completing test unless otherwise instructed.   Once we have confirmed authorization from your insurance company, we will call you to set up a date and time for your test. Based on how quickly your insurance processes prior authorizations requests, please allow up to 4 weeks to be contacted for scheduling your Cardiac CT appointment. Be advised that routine Cardiac CT appointments could be scheduled as many as 8 weeks after your provider has ordered it.  For non-scheduling related questions, please contact the cardiac imaging nurse navigator should you have any questions/concerns: Marchia Bond, Cardiac Imaging Nurse Navigator Burley Saver, Interim Cardiac Imaging Nurse [address] [facility] Services Direct Office Dial: [phone]   For scheduling needs, including cancellations and rescheduling, please call Vivien Rota at [phone], option [phone].    Follow-Up: At [facility],  you and your health needs are our priority.  As part of our continuing mission to provide you with exceptional heart care, we have created designated Provider Care Teams.  These Care Teams include your primary Cardiologist (physician) and Advanced Practice Providers (APPs -  Physician Assistants and Nurse Practitioners) who all work together to  provide you with the care you need, when you need it.  We recommend signing up for the patient portal called "MyChart".  Sign up information is provided on this After Visit Summary.  MyChart is used to connect with patients for Virtual Visits (Telemedicine).  Patients are able to view lab/test results, encounter notes, upcoming appointments, etc.  Non-urgent messages can be sent to your provider as well.   To learn more about what you can do with MyChart, go to [email or web address].    Your next appointment:    AS NEEDED

## 2020-04-09 NOTE — Progress Notes (Signed)
04/09/2020 Marvin Chan   [birth year]  [id number]  Primary Physician Myrlene Broker, MD Primary Cardiologist: Runell Gess MD Nicholes Calamity, [place]  HPI:  Marvin Chan is a 44 y.o.  mildly overweight married Caucasian male father of one 25-1/2-year-old daughter who was initially referred by Dr. Theadora Rama for cardiovascular evaluation because of recent onset of chest pain. He works in Nurse, children's. His cardiovascular risk factor profile is only notable for mild hyperlipidemia but otherwise is negative. He does have several uncles who have had ischemic heart disease but no first-degree relative.  His paternal grandfather died of a myocardial infarction age 59.  He recently developed atypical chest pain while riding his bicycle. He is an avid bicyclist and has had some chest pain since that time without radiation or associated symptoms.  I performed routine GXT on him 02/24/2015 which was entirely normal.  Since I saw him 5 years ago he is done well.  He did run a half marathon 2 years ago without symptoms.  Over the last several months he is noticed some vague discomfort in his left upper chest when running especially when his heart rate is up in the 150 range and was concerned about this.   No outpatient medications have been marked as taking for the 04/09/20 encounter (Office Visit) with Runell Gess, MD.     No Known Allergies  Social History   Socioeconomic History  . Marital status: Married    Spouse name: Not on file  . Number of children: Not on file  . Years of education: Not on file  . Highest education level: Not on file  Occupational History  . Not on file  Tobacco Use  . Smoking status: Former Games developer  . Smokeless tobacco: Never Used  Vaping Use  . Vaping Use: Never used  Substance and Sexual Activity  . Alcohol use: Never    Alcohol/week: 0.0 standard drinks  . Drug use: Never  . Sexual activity: Yes  Other Topics Concern  . Not on file    Social History Narrative  . Not on file   Social Determinants of Health   Financial Resource Strain:   . Difficulty of Paying Living Expenses:   Food Insecurity:   . Worried About Programme researcher, broadcasting/film/video in the Last Year:   . Barista in the Last Year:   Transportation Needs:   . Freight forwarder (Medical):   Marland Kitchen Lack of Transportation (Non-Medical):   Physical Activity:   . Days of Exercise per Week:   . Minutes of Exercise per Session:   Stress:   . Feeling of Stress :   Social Connections:   . Frequency of Communication with Friends and Family:   . Frequency of Social Gatherings with Friends and Family:   . Attends Religious Services:   . Active Member of Clubs or Organizations:   . Attends Banker Meetings:   Marland Kitchen Marital Status:   Intimate Partner Violence:   . Fear of Current or Ex-Partner:   . Emotionally Abused:   Marland Kitchen Physically Abused:   . Sexually Abused:      Review of Systems: General: negative for chills, fever, night sweats or weight changes.  Cardiovascular: negative for chest pain, dyspnea on exertion, edema, orthopnea, palpitations, paroxysmal nocturnal dyspnea or shortness of breath Dermatological: negative for rash Respiratory: negative for cough or wheezing Urologic: negative for hematuria Abdominal: negative for nausea, vomiting, diarrhea, bright red  blood per rectum, melena, or hematemesis Neurologic: negative for visual changes, syncope, or dizziness All other systems reviewed and are otherwise negative except as noted above.    Blood pressure 136/72, pulse (!) 58, height 6\' 4"  (1.93 m), weight 221 lb 9.6 oz (100.5 kg), SpO2 97 %.  General appearance: alert and no distress Neck: no adenopathy, no carotid bruit, no JVD, supple, symmetrical, trachea midline and thyroid not enlarged, symmetric, no tenderness/mass/nodules Lungs: clear to auscultation bilaterally Heart: regular rate and rhythm, S1, S2 normal, no murmur, click, rub or  gallop Extremities: extremities normal, atraumatic, no cyanosis or edema Pulses: 2+ and symmetric Skin: Skin color, texture, turgor normal. No rashes or lesions Neurologic: Alert and oriented X 3, normal strength and tone. Normal symmetric reflexes. Normal coordination and gait  EKG sinus bradycardia 58 without ST or T wave changes.  I personally reviewed this EKG.  ASSESSMENT AND PLAN:   Atypical chest pain Mr. Marvin Chan returns today after last being seen 5 years ago for chest pain.  He had a negative GXT at that time.  His only risk factor is a grandfather who died at age 81 of a myocardial infarction.  He ran a half marathon 2 years ago without symptoms.  Recently he is noticed a vague sensation of discomfort in his chest when he runs and his heart rates up in the 150 range.  He also had Covid back in January and got February vaccine.  I am going to get a 2D echocardiogram and a coronary CTA to further evaluate.  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  He was a fairly heavy alcohol drinker with beer in the past but has since discontinued this which may be contributory.  We will check a lipid liver profile today.  The last time this was checked was 01/31/2017 revealing total cholesterol 175, LDL 100 and HDL 41.      02/02/2017 MD FACP,FACC,FAHA, [facility] 04/09/2020 9:42 AM

## 2020-04-09 NOTE — Telephone Encounter (Signed)
Returned patient phone call to reschedule echocardiogram. Left message to call back.

## 2020-04-09 NOTE — Assessment & Plan Note (Signed)
Mr. Marvin Chan returns today after last being seen 5 years ago for chest pain.  He had a negative GXT at that time.  His only risk factor is a grandfather who died at age 46 of a myocardial infarction.  He ran a half marathon 2 years ago without symptoms.  Recently he is noticed a vague sensation of discomfort in his chest when he runs and his heart rates up in the 150 range.  He also had Covid back in January and got [employer] vaccine.  I am going to get a 2D echocardiogram and a coronary CTA to further evaluate.

## 2020-04-09 NOTE — Assessment & Plan Note (Signed)
History of hypertriglyceridemia in the past.  He was a fairly heavy alcohol drinker with beer in the past but has since discontinued this which may be contributory.  We will check a lipid liver profile today.  The last time this was checked was 01/31/2017 revealing total cholesterol 175, LDL 100 and HDL 41.

## 2020-04-19 ENCOUNTER — Other Ambulatory Visit: Payer: Self-pay

## 2020-04-19 DIAGNOSIS — E781 Pure hyperglyceridemia: Secondary | ICD-10-CM

## 2020-04-19 NOTE — Progress Notes (Signed)
Repeat lipid panel in 3 months.Lab order mailed to patient.

## 2020-04-29 ENCOUNTER — Ambulatory Visit (HOSPITAL_COMMUNITY): Payer: BC Managed Care – PPO

## 2020-05-03 ENCOUNTER — Other Ambulatory Visit (HOSPITAL_COMMUNITY): Payer: BC Managed Care – PPO

## 2020-05-04 ENCOUNTER — Ambulatory Visit (HOSPITAL_COMMUNITY): Payer: BC Managed Care – PPO | Attending: Cardiology

## 2020-05-04 ENCOUNTER — Other Ambulatory Visit: Payer: Self-pay

## 2020-05-04 DIAGNOSIS — R0789 Other chest pain: Secondary | ICD-10-CM | POA: Diagnosis not present

## 2020-05-04 DIAGNOSIS — Z8249 Family history of ischemic heart disease and other diseases of the circulatory system: Secondary | ICD-10-CM | POA: Diagnosis not present

## 2020-05-04 LAB — ECHOCARDIOGRAM COMPLETE
Area-P 1/2: 3.37 cm2
S' Lateral: 3.2 cm

## 2020-05-19 ENCOUNTER — Ambulatory Visit (HOSPITAL_COMMUNITY): Payer: BC Managed Care – PPO

## 2020-06-12 DIAGNOSIS — Z20822 Contact with and (suspected) exposure to covid-19: Secondary | ICD-10-CM | POA: Diagnosis not present

## 2020-07-10 DIAGNOSIS — Z20822 Contact with and (suspected) exposure to covid-19: Secondary | ICD-10-CM | POA: Diagnosis not present

## 2020-07-30 ENCOUNTER — Ambulatory Visit (INDEPENDENT_AMBULATORY_CARE_PROVIDER_SITE_OTHER): Payer: BC Managed Care – PPO | Admitting: *Deleted

## 2020-07-30 ENCOUNTER — Other Ambulatory Visit: Payer: Self-pay

## 2020-07-30 DIAGNOSIS — Z23 Encounter for immunization: Secondary | ICD-10-CM

## 2020-07-30 NOTE — Progress Notes (Signed)
imm

## 2020-08-06 DIAGNOSIS — D485 Neoplasm of uncertain behavior of skin: Secondary | ICD-10-CM | POA: Diagnosis not present

## 2020-08-06 DIAGNOSIS — D170 Benign lipomatous neoplasm of skin and subcutaneous tissue of head, face and neck: Secondary | ICD-10-CM | POA: Diagnosis not present

## 2020-08-06 DIAGNOSIS — D224 Melanocytic nevi of scalp and neck: Secondary | ICD-10-CM | POA: Diagnosis not present

## 2020-08-06 DIAGNOSIS — L820 Inflamed seborrheic keratosis: Secondary | ICD-10-CM | POA: Diagnosis not present

## 2020-08-06 DIAGNOSIS — D225 Melanocytic nevi of trunk: Secondary | ICD-10-CM | POA: Diagnosis not present

## 2020-08-19 ENCOUNTER — Encounter: Payer: Self-pay | Admitting: Internal Medicine

## 2020-08-25 NOTE — Telephone Encounter (Signed)
Please advise 

## 2020-10-11 ENCOUNTER — Encounter: Payer: BC Managed Care – PPO | Admitting: Internal Medicine

## 2020-10-18 ENCOUNTER — Ambulatory Visit (INDEPENDENT_AMBULATORY_CARE_PROVIDER_SITE_OTHER): Payer: BC Managed Care – PPO | Admitting: Internal Medicine

## 2020-10-18 ENCOUNTER — Other Ambulatory Visit: Payer: Self-pay

## 2020-10-18 ENCOUNTER — Encounter: Payer: Self-pay | Admitting: Internal Medicine

## 2020-10-18 VITALS — BP 122/70 | HR 59 | Temp 98.5°F | Ht 76.0 in | Wt 224.4 lb

## 2020-10-18 DIAGNOSIS — Z0000 Encounter for general adult medical examination without abnormal findings: Secondary | ICD-10-CM

## 2020-10-18 DIAGNOSIS — Z1211 Encounter for screening for malignant neoplasm of colon: Secondary | ICD-10-CM | POA: Diagnosis not present

## 2020-10-18 DIAGNOSIS — E781 Pure hyperglyceridemia: Secondary | ICD-10-CM | POA: Diagnosis not present

## 2020-10-18 DIAGNOSIS — J341 Cyst and mucocele of nose and nasal sinus: Secondary | ICD-10-CM | POA: Diagnosis not present

## 2020-10-18 NOTE — Progress Notes (Signed)
   Subjective:   Patient ID: Marvin Chan, male    DOB: September 05, 1976, 45 y.o.   MRN: [id number]  HPI The patient is a 45 YO man coming in for physical.   PMH, [facility], social history reviewed and updated.   Review of Systems  Constitutional: Negative.   HENT: Negative.   Eyes: Negative.   Respiratory: Negative for cough, chest tightness and shortness of breath.   Cardiovascular: Negative for chest pain, palpitations and leg swelling.  Gastrointestinal: Negative for abdominal distention, abdominal pain, constipation, diarrhea, nausea and vomiting.  Musculoskeletal: Negative.   Skin: Negative.   Neurological: Negative.   Psychiatric/Behavioral: Negative.     Objective:  Physical Exam Constitutional:      Appearance: He is well-developed and well-nourished.  HENT:     Head: Normocephalic and atraumatic.  Eyes:     Extraocular Movements: EOM normal.  Cardiovascular:     Rate and Rhythm: Normal rate and regular rhythm.  Pulmonary:     Effort: Pulmonary effort is normal. No respiratory distress.     Breath sounds: Normal breath sounds. No wheezing or rales.  Abdominal:     General: Bowel sounds are normal. There is no distension.     Palpations: Abdomen is soft.     Tenderness: There is no abdominal tenderness. There is no rebound.  Musculoskeletal:        General: No edema.     Cervical back: Normal range of motion.  Skin:    General: Skin is warm and dry.  Neurological:     Mental Status: He is alert and oriented to person, place, and time.     Coordination: Coordination normal.  Psychiatric:        Mood and Affect: Mood and affect normal.     Vitals:   10/18/20 1324  BP: 122/70  Pulse: (!) 59  Temp: 98.5 F (36.9 C)  TempSrc: Oral  SpO2: 97%  Weight: 224 lb 6.4 oz (101.8 kg)  Height: 6\' 4"  (1.93 m)    This visit occurred during the SARS-CoV-2 public health emergency.  Safety protocols were in place, including screening questions prior to the visit, additional usage  of staff PPE, and extensive cleaning of exam room while observing appropriate contact time as indicated for disinfecting solutions.   Assessment & Plan:

## 2020-10-18 NOTE — Patient Instructions (Addendum)
We will get you in with the ENT.   Health Maintenance, Male Adopting a healthy lifestyle and getting preventive care are important in promoting health and wellness. Ask your health care provider about:  The right schedule for you to have regular tests and exams.  Things you can do on your own to prevent diseases and keep yourself healthy. What should I know about diet, weight, and exercise? Eat a healthy diet  Eat a diet that includes plenty of vegetables, fruits, low-fat dairy products, and lean protein.  Do not eat a lot of foods that are high in solid fats, added sugars, or sodium.   Maintain a healthy weight Body mass index (BMI) is a measurement that can be used to identify possible weight problems. It estimates body fat based on height and weight. Your health care provider can help determine your BMI and help you achieve or maintain a healthy weight. Get regular exercise Get regular exercise. This is one of the most important things you can do for your health. Most adults should:  Exercise for at least 150 minutes each week. The exercise should increase your heart rate and make you sweat (moderate-intensity exercise).  Do strengthening exercises at least twice a week. This is in addition to the moderate-intensity exercise.  Spend less time sitting. Even light physical activity can be beneficial. Watch cholesterol and blood lipids Have your blood tested for lipids and cholesterol at 47 years of age, then have this test every 5 years. You may need to have your cholesterol levels checked more often if:  Your lipid or cholesterol levels are high.  You are older than 47 years of age.  You are at high risk for heart disease. What should I know about cancer screening? Many types of cancers can be detected early and may often be prevented. Depending on your health history and family history, you may need to have cancer screening at various ages. This may include screening  for:  Colorectal cancer.  Prostate cancer.  Skin cancer.  Lung cancer. What should I know about heart disease, diabetes, and high blood pressure? Blood pressure and heart disease  High blood pressure causes heart disease and increases the risk of stroke. This is more likely to develop in people who have high blood pressure readings, are of African descent, or are overweight.  Talk with your health care provider about your target blood pressure readings.  Have your blood pressure checked: ? Every 3-5 years if you are 59-75 years of age. ? Every year if you are 84 years old or older.  If you are between the ages of 70 and 79 and are a current or former smoker, ask your health care provider if you should have a one-time screening for abdominal aortic aneurysm (AAA). Diabetes Have regular diabetes screenings. This checks your fasting blood sugar level. Have the screening done:  Once every three years after age 49 if you are at a normal weight and have a low risk for diabetes.  More often and at a younger age if you are overweight or have a high risk for diabetes. What should I know about preventing infection? Hepatitis B If you have a higher risk for hepatitis B, you should be screened for this virus. Talk with your health care provider to find out if you are at risk for hepatitis B infection. Hepatitis C Blood testing is recommended for:  Everyone born from [birth year] through 1967.  Anyone with known risk factors for hepatitis C.  Sexually transmitted infections (STIs)  You should be screened each year for STIs, including gonorrhea and chlamydia, if: ? You are sexually active and are younger than 47 years of age. ? You are older than 47 years of age and your health care provider tells you that you are at risk for this type of infection. ? Your sexual activity has changed since you were last screened, and you are at increased risk for chlamydia or gonorrhea. Ask your health care  provider if you are at risk.  Ask your health care provider about whether you are at high risk for HIV. Your health care provider may recommend a prescription medicine to help prevent HIV infection. If you choose to take medicine to prevent HIV, you should first get tested for HIV. You should then be tested every 3 months for as long as you are taking the medicine. Follow these instructions at home: Lifestyle  Do not use any products that contain nicotine or tobacco, such as cigarettes, e-cigarettes, and chewing tobacco. If you need help quitting, ask your health care provider.  Do not use street drugs.  Do not share needles.  Ask your health care provider for help if you need support or information about quitting drugs. Alcohol use  Do not drink alcohol if your health care provider tells you not to drink.  If you drink alcohol: ? Limit how much you have to 0-2 drinks a day. ? Be aware of how much alcohol is in your drink. In the [place] one drink equals one 12 oz bottle of beer (355 mL), one 5 oz glass of wine (148 mL), or one 1 oz glass of hard liquor (44 mL). General instructions  Schedule regular health, dental, and eye exams.  Stay current with your vaccines.  Tell your health care provider if: ? You often feel depressed. ? You have ever been abused or do not feel safe at home. Summary  Adopting a healthy lifestyle and getting preventive care are important in promoting health and wellness.  Follow your health care provider's instructions about healthy diet, exercising, and getting tested or screened for diseases.  Follow your health care provider's instructions on monitoring your cholesterol and blood pressure. This information is not intended to replace advice given to you by your health care provider. Make sure you discuss any questions you have with your health care provider. Document Revised: 09/18/2018 Document Reviewed: 09/18/2018 Elsevier Patient Education  2021  [employer].

## 2020-10-21 DIAGNOSIS — J341 Cyst and mucocele of nose and nasal sinus: Secondary | ICD-10-CM | POA: Insufficient documentation

## 2020-10-21 NOTE — Assessment & Plan Note (Signed)
Flu shot up to date. Covid-19 up to date including booster. Tetanus up to date. Colonoscopy GI referral placed. Counseled about sun safety and mole surveillance. Counseled about the dangers of distracted driving. Given 10 year screening recommendations.

## 2020-10-21 NOTE — Assessment & Plan Note (Addendum)
Seen on dental imaging and he has that imaging to take with him to ENT visit to review. There is some family history of sinus cancer.

## 2020-10-21 NOTE — Assessment & Plan Note (Signed)
Recent lipids at goal 

## 2020-11-11 DIAGNOSIS — Z87891 Personal history of nicotine dependence: Secondary | ICD-10-CM | POA: Diagnosis not present

## 2020-11-11 DIAGNOSIS — J342 Deviated nasal septum: Secondary | ICD-10-CM | POA: Diagnosis not present

## 2020-11-11 DIAGNOSIS — Z9889 Other specified postprocedural states: Secondary | ICD-10-CM | POA: Diagnosis not present

## 2020-11-11 DIAGNOSIS — J341 Cyst and mucocele of nose and nasal sinus: Secondary | ICD-10-CM | POA: Diagnosis not present

## 2020-11-11 DIAGNOSIS — J343 Hypertrophy of nasal turbinates: Secondary | ICD-10-CM | POA: Diagnosis not present

## 2020-11-27 DIAGNOSIS — Z20822 Contact with and (suspected) exposure to covid-19: Secondary | ICD-10-CM | POA: Diagnosis not present

## 2021-02-16 DIAGNOSIS — H9042 Sensorineural hearing loss, unilateral, left ear, with unrestricted hearing on the contralateral side: Secondary | ICD-10-CM | POA: Diagnosis not present

## 2021-02-16 DIAGNOSIS — H9313 Tinnitus, bilateral: Secondary | ICD-10-CM | POA: Diagnosis not present

## 2021-04-27 ENCOUNTER — Encounter: Payer: Self-pay | Admitting: Internal Medicine

## 2021-07-21 DIAGNOSIS — J342 Deviated nasal septum: Secondary | ICD-10-CM | POA: Diagnosis not present

## 2021-07-21 DIAGNOSIS — J343 Hypertrophy of nasal turbinates: Secondary | ICD-10-CM | POA: Diagnosis not present

## 2021-07-21 DIAGNOSIS — J341 Cyst and mucocele of nose and nasal sinus: Secondary | ICD-10-CM | POA: Diagnosis not present

## 2021-07-21 DIAGNOSIS — J3489 Other specified disorders of nose and nasal sinuses: Secondary | ICD-10-CM | POA: Diagnosis not present

## 2021-07-21 DIAGNOSIS — Z9889 Other specified postprocedural states: Secondary | ICD-10-CM | POA: Diagnosis not present

## 2021-07-21 DIAGNOSIS — Z87891 Personal history of nicotine dependence: Secondary | ICD-10-CM | POA: Diagnosis not present

## 2021-08-01 NOTE — Progress Notes (Signed)
Virtual Visit via Video Note  I connected with Marvin Chan on 08/01/21 at  8:50 AM EDT by a video enabled telemedicine application and verified that I am speaking with the correct person using two identifiers.   I discussed the limitations of evaluation and management by telemedicine and the availability of in person appointments. The patient expressed understanding and agreed to proceed.  Present for the visit:  Myself, Dr Cheryll Cockayne, Marvin Chan.  The patient is currently at home and I am in the office.    No referring provider.    History of Present Illness: He is here for an acute visit for cold symptoms.  His symptoms started about 9 days ago  He is experiencing ST, swollen gland, cough, runny nose, hoarseness.  He felt like he was getting better until yesterday he had a fever of 101 and had chills.  That concerned him so he did call to make an appointment.  He did take ibuprofen last night, which he has not really been taking and that really helped and he does feel much better today.  His sore throat at this point is mild and he has mild nasal congestion.  He does have a cough that is primarily dry, but he feels like a little congestion in his chest.  He did have some abdominal pain yesterday.  He has had some myalgias and headaches.  Overall he does feel better.  His kids were sick when he got this as well and they have recovered.  2 COVID tests were negative.  He has tried taking ibuprofen.  He is taking vitamins.        Review of Systems  Constitutional:  Positive for chills and fever.  HENT:  Positive for congestion (mild) and sore throat (mild). Negative for sinus pain.   Respiratory:  Positive for cough. Negative for sputum production (feels like congestion in chest), shortness of breath and wheezing.   Gastrointestinal:  Positive for abdominal pain (yesterday). Negative for diarrhea.  Musculoskeletal:  Positive for myalgias.  Neurological:  Positive for headaches.      Social History   Socioeconomic History   Marital status: Married    Spouse name: Not on file   Number of children: Not on file   Years of education: Not on file   Highest education level: Not on file  Occupational History   Not on file  Tobacco Use   Smoking status: Former   Smokeless tobacco: Never  Vaping Use   Vaping Use: Never used  Substance and Sexual Activity   Alcohol use: Never    Alcohol/week: 0.0 standard drinks   Drug use: Never   Sexual activity: Yes  Other Topics Concern   Not on file  Social History Narrative   Not on file   Social Determinants of Health   Financial Resource Strain: Not on file  Food Insecurity: Not on file  Transportation Needs: Not on file  Physical Activity: Not on file  Stress: Not on file  Social Connections: Not on file     Observations/Objective: Appears well in NAD Breathing normally Skin appears warm and dry  Assessment and Plan:   URI Acute Symptoms likely viral in nature COVID test x2 negative Hard to explain his fever yesterday, but he is feeling better today and symptoms are improving No need for an antibiotic at this point Continue symptomatic treatment with over-the-counter cold medications, Tylenol/ibuprofen Increase rest and fluids Call if symptoms worsen or do not improve  Follow Up Instructions:    I discussed the assessment and treatment plan with the patient. The patient was provided an opportunity to ask questions and all were answered. The patient agreed with the plan and demonstrated an understanding of the instructions.   The patient was advised to call back or seek an in-person evaluation if the symptoms worsen or if the condition fails to improve as anticipated.    Binnie Rail, MD

## 2021-08-02 ENCOUNTER — Telehealth: Payer: BC Managed Care – PPO | Admitting: Internal Medicine

## 2021-08-02 ENCOUNTER — Encounter: Payer: Self-pay | Admitting: Internal Medicine

## 2021-08-02 DIAGNOSIS — J069 Acute upper respiratory infection, unspecified: Secondary | ICD-10-CM

## 2021-08-03 ENCOUNTER — Encounter: Payer: Self-pay | Admitting: Internal Medicine

## 2021-08-05 MED ORDER — AZITHROMYCIN 250 MG PO TABS
ORAL_TABLET | ORAL | 0 refills | Status: DC
Start: 2021-08-05 — End: 2021-12-12

## 2021-12-12 ENCOUNTER — Other Ambulatory Visit: Payer: Self-pay

## 2021-12-12 ENCOUNTER — Other Ambulatory Visit: Payer: Self-pay | Admitting: Internal Medicine

## 2021-12-12 ENCOUNTER — Encounter: Payer: Self-pay | Admitting: Internal Medicine

## 2021-12-12 ENCOUNTER — Ambulatory Visit: Payer: BC Managed Care – PPO | Admitting: Internal Medicine

## 2021-12-12 DIAGNOSIS — K602 Anal fissure, unspecified: Secondary | ICD-10-CM | POA: Diagnosis not present

## 2021-12-12 MED ORDER — NITROGLYCERIN 0.4 % RE OINT: TOPICAL_OINTMENT | RECTAL | 1 refills | Status: DC

## 2021-12-12 NOTE — Progress Notes (Signed)
? ?  Subjective:  ? ?Patient ID: Marvin Chan, male    DOB: 24-Feb-1976, 46 y.o.   MRN: [id number] ? ?HPI ?The patient is a 46 YO man coming in for concerns about pain in anus 2 weeks. ? ?Review of Systems  ?Constitutional: Negative.   ?HENT: Negative.    ?Eyes: Negative.   ?Respiratory:  Negative for cough, chest tightness and shortness of breath.   ?Cardiovascular:  Negative for chest pain, palpitations and leg swelling.  ?Gastrointestinal:  Positive for rectal pain. Negative for abdominal distention, abdominal pain, anal bleeding, blood in stool, constipation, diarrhea, nausea and vomiting.  ?Musculoskeletal: Negative.   ?Skin: Negative.   ?Neurological: Negative.   ?Psychiatric/Behavioral: Negative.    ? ?Objective:  ?Physical Exam ?Constitutional:   ?   Appearance: He is well-developed.  ?HENT:  ?   Head: Normocephalic and atraumatic.  ?Cardiovascular:  ?   Rate and Rhythm: Normal rate and regular rhythm.  ?Pulmonary:  ?   Effort: Pulmonary effort is normal. No respiratory distress.  ?   Breath sounds: Normal breath sounds. No wheezing or rales.  ?Abdominal:  ?   General: Bowel sounds are normal. There is no distension.  ?   Palpations: Abdomen is soft.  ?   Tenderness: There is no abdominal tenderness. There is no rebound.  ?Genitourinary: ?   Comments: Small hemorrhoid not inflamed 12 oclock anus, no skin breakdown, small fissure ?Musculoskeletal:  ?   Cervical back: Normal range of motion.  ?Skin: ?   General: Skin is warm and dry.  ?Neurological:  ?   Mental Status: He is alert and oriented to person, place, and time.  ?   Coordination: Coordination normal.  ? ? ?Vitals:  ? 12/12/21 1328  ?BP: 112/80  ?Pulse: 69  ?Temp: 98.5 ?F (36.9 ?C)  ?TempSrc: Oral  ?SpO2: 97%  ?Weight: 210 lb 6 oz (95.4 kg)  ?Height: 6\' 4"  (1.93 m)  ? ? ?This visit occurred during the SARS-CoV-2 public health emergency.  Safety protocols were in place, including screening questions prior to the visit, additional usage of staff PPE, and  extensive cleaning of exam room while observing appropriate contact time as indicated for disinfecting solutions.  ? ?Assessment & Plan:  ? ?

## 2021-12-12 NOTE — Patient Instructions (Signed)
We have sent in the nitro ointment to use twice a day topically for 2 weeks. Pea sized amount. ?

## 2021-12-12 NOTE — Assessment & Plan Note (Signed)
Rx nitro ointment to use topically for anal fissure. Advised sitz bath and avoid hard stools or constipation. ?

## 2021-12-13 ENCOUNTER — Encounter: Payer: Self-pay | Admitting: Internal Medicine

## 2021-12-13 NOTE — Telephone Encounter (Signed)
PA initiated on covermymeds. Waiting for determination.  ?

## 2021-12-16 ENCOUNTER — Other Ambulatory Visit: Payer: Self-pay

## 2021-12-16 ENCOUNTER — Telehealth: Payer: Self-pay | Admitting: Internal Medicine

## 2021-12-16 ENCOUNTER — Encounter: Payer: Self-pay | Admitting: Internal Medicine

## 2021-12-16 DIAGNOSIS — K602 Anal fissure, unspecified: Secondary | ICD-10-CM

## 2021-12-16 MED ORDER — DILTIAZEM GEL 2 %: 1.0000 "application " | Freq: Two times a day (BID) | CUTANEOUS | 0 refills | Status: DC

## 2021-12-16 MED ORDER — DILTIAZEM GEL 2 %
1.0000 | Freq: Two times a day (BID) | CUTANEOUS | 0 refills | Status: DC
Start: 2021-12-16 — End: 2021-12-16

## 2021-12-16 NOTE — Telephone Encounter (Signed)
Robin with [facility] calls today in regards to PT's prescription for diltiazem 2% gel. [facility] has not received the [facility] for this, and I see where Dr.Crawford had corresponded with PT aswell as Byrd Hesselbach putting in the order for the medication, but [facility] has only received the [place]. I do notice the diltiazem 2% gel is showing as discontinued but I'm not sure if that has anything to do with what's going on either. ? ?CB for Pharmacist: [phone] ?Fax: [phone] ?

## 2021-12-16 NOTE — Telephone Encounter (Signed)
Pt checking status of 2% diltiazem hydrochloride gel Rx ? ? ?Rx is on pts med list but has not been sent to pharmacy  ? ? ?

## 2021-12-18 ENCOUNTER — Encounter: Payer: Self-pay | Admitting: Internal Medicine

## 2021-12-19 ENCOUNTER — Telehealth: Payer: Self-pay | Admitting: Internal Medicine

## 2021-12-19 NOTE — Telephone Encounter (Signed)
Pt requesting an referral to a specialist for hemorrhoids ? ?Please see pts 3-6 mychart message ? ?Pt requesting a cb  ?

## 2021-12-20 NOTE — Addendum Note (Signed)
Addended by: Hillard Danker A on: 12/20/2021 11:37 AM ? ? Modules accepted: Orders ? ?

## 2021-12-21 DIAGNOSIS — K602 Anal fissure, unspecified: Secondary | ICD-10-CM | POA: Diagnosis not present

## 2021-12-21 NOTE — Telephone Encounter (Signed)
Already handled in mychart message ?

## 2021-12-30 DIAGNOSIS — L821 Other seborrheic keratosis: Secondary | ICD-10-CM | POA: Diagnosis not present

## 2021-12-30 DIAGNOSIS — D1730 Benign lipomatous neoplasm of skin and subcutaneous tissue of unspecified sites: Secondary | ICD-10-CM | POA: Diagnosis not present

## 2021-12-30 DIAGNOSIS — L570 Actinic keratosis: Secondary | ICD-10-CM | POA: Diagnosis not present

## 2022-01-03 DIAGNOSIS — K602 Anal fissure, unspecified: Secondary | ICD-10-CM | POA: Diagnosis not present

## 2022-01-27 DIAGNOSIS — K602 Anal fissure, unspecified: Secondary | ICD-10-CM | POA: Diagnosis not present

## 2022-02-13 DIAGNOSIS — L821 Other seborrheic keratosis: Secondary | ICD-10-CM | POA: Diagnosis not present

## 2022-02-13 DIAGNOSIS — D170 Benign lipomatous neoplasm of skin and subcutaneous tissue of head, face and neck: Secondary | ICD-10-CM | POA: Diagnosis not present

## 2022-02-13 DIAGNOSIS — D225 Melanocytic nevi of trunk: Secondary | ICD-10-CM | POA: Diagnosis not present

## 2022-08-29 ENCOUNTER — Ambulatory Visit: Payer: BC Managed Care – PPO | Admitting: Internal Medicine

## 2022-08-29 ENCOUNTER — Encounter: Payer: Self-pay | Admitting: Internal Medicine

## 2022-08-29 VITALS — BP 122/62 | HR 53 | Temp 98.4°F | Ht 76.0 in | Wt 215.0 lb

## 2022-08-29 DIAGNOSIS — E781 Pure hyperglyceridemia: Secondary | ICD-10-CM

## 2022-08-29 DIAGNOSIS — Z1211 Encounter for screening for malignant neoplasm of colon: Secondary | ICD-10-CM

## 2022-08-29 DIAGNOSIS — F5101 Primary insomnia: Secondary | ICD-10-CM | POA: Diagnosis not present

## 2022-08-29 DIAGNOSIS — Z0000 Encounter for general adult medical examination without abnormal findings: Secondary | ICD-10-CM | POA: Diagnosis not present

## 2022-08-29 DIAGNOSIS — G4700 Insomnia, unspecified: Secondary | ICD-10-CM | POA: Insufficient documentation

## 2022-08-29 MED ORDER — ZOLPIDEM TARTRATE 5 MG PO TABS: 5.0000 mg | ORAL_TABLET | Freq: Every evening | ORAL | 0 refills | Status: DC | PRN

## 2022-08-29 NOTE — Patient Instructions (Addendum)
We have sent in the ambien to use for sleep.  We will send the cologuard to the house to do.  You can come get labs anytime.

## 2022-08-29 NOTE — Assessment & Plan Note (Signed)
Flu shot declines. Covid-19 counseled. Tetanus up to date. Cologuard ordered. Counseled about sun safety and mole surveillance. Counseled about the dangers of distracted driving. Given 10 year screening recommendations.  ? ?

## 2022-08-29 NOTE — Assessment & Plan Note (Signed)
Mostly using natural methods to help. Sometimes wakes up in night and cannot get back to sleep. Rx for 10 ambien 5 mg to use when traveling or when multiple nights no sleep. Counseled about risk and benefit.

## 2022-08-29 NOTE — Assessment & Plan Note (Signed)
Checking lipid panel and CBC and CMP. Adjust as needed. Diet is better lately.

## 2022-08-29 NOTE — Progress Notes (Signed)
   Subjective:   Patient ID: Marvin Chan, male    DOB: August 16, 1976, 46 y.o.   MRN: [id number]  Insomnia Primary symptoms: sleep disturbance.     The patient is a 46 YO man coming in for physical and with new sleeping problems.  PMH, [facility], social history reviewed and updated  Review of Systems  Constitutional: Negative.   HENT: Negative.    Eyes: Negative.   Respiratory:  Negative for cough, chest tightness and shortness of breath.   Cardiovascular:  Negative for chest pain, palpitations and leg swelling.  Gastrointestinal:  Negative for abdominal distention, abdominal pain, constipation, diarrhea, nausea and vomiting.  Musculoskeletal: Negative.   Skin: Negative.   Neurological: Negative.   Psychiatric/Behavioral:  Positive for sleep disturbance. The patient has insomnia.     Objective:  Physical Exam Constitutional:      Appearance: He is well-developed.  HENT:     Head: Normocephalic and atraumatic.  Cardiovascular:     Rate and Rhythm: Normal rate and regular rhythm.  Pulmonary:     Effort: Pulmonary effort is normal. No respiratory distress.     Breath sounds: Normal breath sounds. No wheezing or rales.  Abdominal:     General: Bowel sounds are normal. There is no distension.     Palpations: Abdomen is soft.     Tenderness: There is no abdominal tenderness. There is no rebound.  Musculoskeletal:     Cervical back: Normal range of motion.  Skin:    General: Skin is warm and dry.  Neurological:     Mental Status: He is alert and oriented to person, place, and time.     Coordination: Coordination normal.     Vitals:   08/29/22 1048  BP: 122/62  Pulse: (!) 53  Temp: 98.4 F (36.9 C)  TempSrc: Oral  SpO2: 98%  Weight: 215 lb (97.5 kg)  Height: 6\' 4"  (1.93 m)    Assessment & Plan:

## 2022-09-05 ENCOUNTER — Ambulatory Visit: Payer: BC Managed Care – PPO | Admitting: Internal Medicine

## 2022-09-06 LAB — COLOGUARD

## 2022-09-19 DIAGNOSIS — Z1211 Encounter for screening for malignant neoplasm of colon: Secondary | ICD-10-CM | POA: Diagnosis not present

## 2022-09-26 LAB — COLOGUARD: COLOGUARD: NEGATIVE

## 2022-10-31 ENCOUNTER — Ambulatory Visit: Payer: BC Managed Care – PPO | Admitting: Internal Medicine

## 2022-10-31 ENCOUNTER — Encounter: Payer: Self-pay | Admitting: Internal Medicine

## 2022-10-31 VITALS — BP 120/78 | HR 60 | Temp 98.4°F | Ht 76.0 in | Wt 214.2 lb

## 2022-10-31 DIAGNOSIS — R202 Paresthesia of skin: Secondary | ICD-10-CM

## 2022-10-31 DIAGNOSIS — R200 Anesthesia of skin: Secondary | ICD-10-CM | POA: Insufficient documentation

## 2022-10-31 DIAGNOSIS — I861 Scrotal varices: Secondary | ICD-10-CM | POA: Diagnosis not present

## 2022-10-31 DIAGNOSIS — E781 Pure hyperglyceridemia: Secondary | ICD-10-CM | POA: Diagnosis not present

## 2022-10-31 NOTE — Assessment & Plan Note (Signed)
We did discuss treatment options and he will think about if he wishes to pursue this with urology.

## 2022-10-31 NOTE — Assessment & Plan Note (Signed)
Has changed diet and no recent labs. He does not like lab draws will come for labs soon.

## 2022-10-31 NOTE — Progress Notes (Signed)
   Subjective:   Patient ID: Marvin Chan, male    DOB: [birth year], 47 y.o.   MRN: [id number]  HPI The patient is a 47 YO man coming in for overall health and new right hand numbness in the morning.  Review of Systems  Constitutional: Negative.   HENT: Negative.    Eyes: Negative.   Respiratory:  Negative for cough, chest tightness and shortness of breath.   Cardiovascular:  Negative for chest pain, palpitations and leg swelling.  Gastrointestinal:  Negative for abdominal distention, abdominal pain, constipation, diarrhea, nausea and vomiting.  Musculoskeletal: Negative.   Skin: Negative.   Neurological:  Positive for numbness.  Psychiatric/Behavioral: Negative.      Objective:  Physical Exam Constitutional:      Appearance: He is well-developed.  HENT:     Head: Normocephalic and atraumatic.  Cardiovascular:     Rate and Rhythm: Normal rate and regular rhythm.  Pulmonary:     Effort: Pulmonary effort is normal. No respiratory distress.     Breath sounds: Normal breath sounds. No wheezing or rales.  Abdominal:     General: Bowel sounds are normal. There is no distension.     Palpations: Abdomen is soft.     Tenderness: There is no abdominal tenderness. There is no rebound.  Musculoskeletal:     Cervical back: Normal range of motion.  Skin:    General: Skin is warm and dry.  Neurological:     Mental Status: He is alert and oriented to person, place, and time.     Coordination: Coordination normal.     Vitals:   10/31/22 0952  BP: 120/78  Pulse: 60  Temp: 98.4 F (36.9 C)  TempSrc: Oral  SpO2: 98%  Weight: 214 lb 3.2 oz (97.2 kg)  Height: 6\' 4"  (1.93 m)    Assessment & Plan:  Visit time 20 minutes in face to face communication with patient and coordination of care, additional 5 minutes spent in record review, coordination or care, ordering tests, communicating/referring to other healthcare professionals, documenting in medical records all on the same day of the  visit for total time 25 minutes spent on the visit.

## 2022-10-31 NOTE — Assessment & Plan Note (Signed)
Suspect carpal tunnel. He wishes to defer labs if possible. Will try carpal tunnel brace for 1-2 weeks at night time. If not improving will check labs for metabolic causes with SWH6P, TSH, B12, vitamin D, CBC, CMP.

## 2022-11-04 ENCOUNTER — Telehealth: Payer: BC Managed Care – PPO | Admitting: Family Medicine

## 2022-11-04 DIAGNOSIS — U071 COVID-19: Secondary | ICD-10-CM | POA: Diagnosis not present

## 2022-11-04 NOTE — Patient Instructions (Signed)

## 2022-11-04 NOTE — Progress Notes (Signed)
Virtual Visit Consent   Marvin Chan, you are scheduled for a virtual visit with a [facility] provider today. Just as with appointments in the office, your consent must be obtained to participate. Your consent will be active for this visit and any virtual visit you may have with one of our providers in the next 365 days. If you have a MyChart account, a copy of this consent can be sent to you electronically.  As this is a virtual visit, video technology does not allow for your provider to perform a traditional examination. This may limit your provider's ability to fully assess your condition. If your provider identifies any concerns that need to be evaluated in person or the need to arrange testing (such as labs, EKG, etc.), we will make arrangements to do so. Although advances in technology are sophisticated, we cannot ensure that it will always work on either your end or our end. If the connection with a video visit is poor, the visit may have to be switched to a telephone visit. With either a video or telephone visit, we are not always able to ensure that we have a secure connection.  By engaging in this virtual visit, you consent to the provision of healthcare and authorize for your insurance to be billed (if applicable) for the services provided during this visit. Depending on your insurance coverage, you may receive a charge related to this service.  I need to obtain your verbal consent now. Are you willing to proceed with your visit today? Shields Pautz has provided verbal consent on 11/04/2022 for a virtual visit (video or telephone). Dellia Nims, FNP  Date: 11/04/2022 3:02 PM  Virtual Visit via Video Note   I, Dellia Nims, connected with  Marvin Chan  ([id number], 1975-11-17) on 11/04/22 at  3:00 PM EST by a video-enabled telemedicine application and verified that I am speaking with the correct person using two identifiers.  Location: Patient: Virtual Visit Location Patient: Home Provider:  Virtual Visit Location Provider: Home Office   I discussed the limitations of evaluation and management by telemedicine and the availability of in person appointments. The patient expressed understanding and agreed to proceed.    History of Present Illness: Marvin Chan is a 47 y.o. who identifies as a male who was assigned male at birth, and is being seen today for covid positive testing at home, fever, chills and body aches. He declines meds and feels he will try to heal on his own after discussing guidelines and recommendations. Marland Kitchen  HPI: HPI  Problems:  Patient Active Problem List   Diagnosis Date Noted   Varicocele 10/31/2022   Numbness and tingling in right hand 10/31/2022   Insomnia 08/29/2022   Family history of heart disease 04/09/2020   Routine general medical examination at a health care facility 04/03/2018   Hypertriglyceridemia without hypercholesterolemia 01/29/2015    Allergies: No Known Allergies Medications:  Current Outpatient Medications:    zolpidem (AMBIEN) 5 MG tablet, Take 1 tablet (5 mg total) by mouth at bedtime as needed for sleep., Disp: 10 tablet, Rfl: 0  Observations/Objective: Patient is well-developed, well-nourished in no acute distress.  Resting comfortably  at home.  Head is normocephalic, atraumatic.  No labored breathing.  Speech is clear and coherent with logical content.  Patient is alert and oriented at baseline.    Assessment and Plan: 1. COVID-19  Increase fluids, humidifier at night, ibuprofen as directed, Mvi, quarantine. Urgent care if sx worsen.   Follow Up Instructions: I  discussed the assessment and treatment plan with the patient. The patient was provided an opportunity to ask questions and all were answered. The patient agreed with the plan and demonstrated an understanding of the instructions.  A copy of instructions were sent to the patient via MyChart unless otherwise noted below.     The patient was advised to call back or  seek an in-person evaluation if the symptoms worsen or if the condition fails to improve as anticipated.  Time:  I spent 10 minutes with the patient via telehealth technology discussing the above problems/concerns.    Dellia Nims, FNP

## 2022-11-24 DIAGNOSIS — Z9889 Other specified postprocedural states: Secondary | ICD-10-CM | POA: Diagnosis not present

## 2022-11-24 DIAGNOSIS — Z87891 Personal history of nicotine dependence: Secondary | ICD-10-CM | POA: Diagnosis not present

## 2022-11-24 DIAGNOSIS — J3489 Other specified disorders of nose and nasal sinuses: Secondary | ICD-10-CM | POA: Diagnosis not present

## 2022-11-24 DIAGNOSIS — J342 Deviated nasal septum: Secondary | ICD-10-CM | POA: Diagnosis not present

## 2022-11-24 DIAGNOSIS — J343 Hypertrophy of nasal turbinates: Secondary | ICD-10-CM | POA: Diagnosis not present

## 2022-11-24 DIAGNOSIS — J341 Cyst and mucocele of nose and nasal sinus: Secondary | ICD-10-CM | POA: Diagnosis not present

## 2023-01-29 ENCOUNTER — Encounter: Payer: Self-pay | Admitting: Internal Medicine

## 2023-01-31 MED ORDER — ZOLPIDEM TARTRATE 5 MG PO TABS: 5.0000 mg | ORAL_TABLET | Freq: Every evening | ORAL | 0 refills | Status: DC | PRN

## 2023-05-17 NOTE — Progress Notes (Signed)
[facility] [address] [place] [address] Phone: [phone] Subjective:   Bruce Donath, am serving as a scribe for Dr. Antoine Primas.  I'm seeing this patient by the request  of:  Myrlene Broker, MD  CC: Ankle and shoulder pain  [id number]  10/11/2018 Aspiration noted. Discussed icing regimen and home exercise. Which activities of doing which wants to avoid. Patient will do more compression and icing regimen. Discussed topical anti-inflammatories. Follow-up again for an 8 weeks.   Updated 812/2024 Marvin Chan is a 47 y.o. male coming in with complaint of L ankle and shoulder pain. Pain with yoga and feels like his ankle gives out at times when walking. Pain occurs mostly in morning and with DF.   Injury 15 years ago to L shoulder. Did PT after a FOOSH injury when snowboarding. Recently, he notices he has some crunching in the shoulder when doing yoga.    We have seen patient previously in 2020 for more of a right sided peroneal tendinitis.    Past Medical History:  Diagnosis Date   Chest pain    Hyperlipidemia    Past Surgical History:  Procedure Laterality Date   NASAL/SINUS ENDOSCOPY  2011   varicose ligation   1993   Social History   Socioeconomic History   Marital status: Married    Spouse name: Not on file   Number of children: Not on file   Years of education: Not on file   Highest education level: Not on file  Occupational History   Not on file  Tobacco Use   Smoking status: Former   Smokeless tobacco: Never  Vaping Use   Vaping status: Never Used  Substance and Sexual Activity   Alcohol use: Never    Alcohol/week: 0.0 standard drinks of alcohol   Drug use: Never   Sexual activity: Yes  Other Topics Concern   Not on file  Social History Narrative   Not on file   Social Determinants of Health   Financial Resource Strain: Low Risk  (12/19/2021)   Received from [facility], [facility]   Overall  Financial Resource Strain (CARDIA)    Difficulty of Paying Living Expenses: Not very hard  Food Insecurity: No Food Insecurity (12/19/2021)   Received [facility], [facility]   Hunger Vital Sign    Worried About Running Out of Food in the Last Year: Never true    Ran Out of Food in the Last Year: Never true  Transportation Needs: Not on file  Physical Activity: Sufficiently Active (12/19/2021)   Received from [facility], [facility]   Exercise Vital Sign    Days of Exercise per Week: 5 days    Minutes of Exercise per Session: 60 min  Stress: No Stress Concern Present (12/19/2021)   Received from [facility], [facility]   [employer] of Occupational Health - Occupational Stress Questionnaire    Feeling of Stress : Only a little  Social Connections: Unknown (12/25/2022)   Received from [facility], [facility]   Social Network    Social Network: Not on file   No Known Allergies Family History  Problem Relation Age of Onset   Diabetes Father    Hyperlipidemia Father    Alcohol abuse Paternal Grandmother    Early death Paternal Grandfather    Heart disease Paternal Grandfather        Current Outpatient Medications (Analgesics):    meloxicam (MOBIC) 15 MG tablet, Take  1 tablet (15 mg total) by mouth daily.   Current Outpatient Medications (Other):    zolpidem (AMBIEN) 5 MG tablet, Take 1 tablet (5 mg total) by mouth at bedtime as needed for sleep.   Reviewed prior external information including notes and imaging from  primary care provider As well as notes that were available from care everywhere and other healthcare systems.  Past medical history, social, surgical and family history all reviewed in electronic medical record.  No pertanent information unless stated regarding to the chief complaint.   Review of Systems:  No headache, visual changes, nausea, vomiting, diarrhea, constipation, dizziness, abdominal pain, skin rash, fevers, chills,  night sweats, weight loss, swollen lymph nodes, body aches, joint swelling, chest pain, shortness of breath, mood changes. POSITIVE muscle aches  Objective  Blood pressure 118/78, pulse 73, height 6\' 4"  (1.93 m), weight 205 lb (93 kg), SpO2 98%.   General: No apparent distress alert and oriented x3 mood and affect normal, dressed appropriately.  HEENT: Pupils equal, extraocular movements intact  Respiratory: Patient's speak in full sentences and does not appear short of breath  Cardiovascular: No lower extremity edema, non tender, no erythema  Shoulder exam shows ttp laterally.  Patient does have positive impingement noted as well.  Patient's rotator cuff strength does appear to be intact.  Foot exam shows some minor tenderness to palpation over the foot itself.  Good range of motion were noted.  Limited muscular skeletal ultrasound was performed and interpreted by Antoine Primas, M  Limited ultrasound of patient's left shoulder shows t acute on chronic changes noted of the supraspinatus.  No significant or tearing appreciated at this time.  Patient's acromioclavicular joint appears to be all right at this time.  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

## 2023-05-21 ENCOUNTER — Ambulatory Visit (INDEPENDENT_AMBULATORY_CARE_PROVIDER_SITE_OTHER): Payer: BC Managed Care – PPO

## 2023-05-21 ENCOUNTER — Ambulatory Visit: Payer: BC Managed Care – PPO | Admitting: Family Medicine

## 2023-05-21 ENCOUNTER — Encounter: Payer: Self-pay | Admitting: Family Medicine

## 2023-05-21 ENCOUNTER — Other Ambulatory Visit: Payer: Self-pay

## 2023-05-21 VITALS — BP 118/78 | HR 73 | Ht 76.0 in | Wt 205.0 lb

## 2023-05-21 DIAGNOSIS — G8929 Other chronic pain: Secondary | ICD-10-CM

## 2023-05-21 DIAGNOSIS — M75112 Incomplete rotator cuff tear or rupture of left shoulder, not specified as traumatic: Secondary | ICD-10-CM | POA: Diagnosis not present

## 2023-05-21 DIAGNOSIS — M75102 Unspecified rotator cuff tear or rupture of left shoulder, not specified as traumatic: Secondary | ICD-10-CM | POA: Insufficient documentation

## 2023-05-21 DIAGNOSIS — M25572 Pain in left ankle and joints of left foot: Secondary | ICD-10-CM

## 2023-05-21 DIAGNOSIS — M25512 Pain in left shoulder: Secondary | ICD-10-CM

## 2023-05-21 MED ORDER — MELOXICAM 15 MG PO TABS: 15.0000 mg | ORAL_TABLET | Freq: Every day | ORAL | 0 refills | Status: DC

## 2023-05-21 NOTE — Assessment & Plan Note (Addendum)
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  Patient has had some weakness.  Will get x-rays to further evaluate especially with the Bankart tear noted.  Discussed icing regimen and home exercises.  Discussed which activities to do and which ones to avoid.  Increase activity slowly.  Discussed potential nitroglycerin but has had headaches previously.  Did discuss the possibility of PRP.  Follow-up again in 6 to 8 weeks otherwise.  Meloxicam prescribed

## 2023-05-21 NOTE — Patient Instructions (Addendum)
Xray today Exercises Keep hands in peripheral vision Ice Meloxicam 15mg  Voltaren See me again in 2 months

## 2023-07-17 NOTE — Progress Notes (Signed)
[facility] [address] [place] [address] Phone: [phone] Subjective:   Marvin Chan, am serving as a scribe for Dr. Antoine Primas.  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  Patient has had some weakness.  Will get x-rays to further evaluate especially with the [place] tear noted.  Discussed icing regimen and home exercises.  Discussed which activities to do and which ones to avoid.  Increase activity slowly.  Discussed potential nitroglycerin but has had headaches previously.  Did discuss the possibility of PRP.  Follow-up again in 6 to 8 weeks otherwise.  Meloxicam prescribed      Update 07/24/2023 Nathanel Tallman is a 47 y.o. male coming in with complaint of L shoulder pain. Patient states continues to have some discomfort.  Has not been very compliant on the exercises.  Describes the pain as a dull, throbbing aching discomfort.  Worse with certain range of motion especially with reaching behind his back      Past Medical History:  Diagnosis Date   Chest pain    Hyperlipidemia    Past Surgical History:  Procedure Laterality Date   NASAL/SINUS ENDOSCOPY  2011   varicose ligation   1993   Social History   Socioeconomic History   Marital status: Married    Spouse name: Not on file   Number of children: Not on file   Years of education: Not on file   Highest education level: Not on file  Occupational History   Not on file  Tobacco Use   Smoking status: Former   Smokeless tobacco: Never  Vaping Use   Vaping status: Never Used  Substance and Sexual Activity   Alcohol use: Never    Alcohol/week: 0.0 standard drinks of alcohol   Drug use: Never   Sexual activity: Yes  Other Topics Concern   Not on file  Social History Narrative   Not on file   Social Determinants of  Health   Financial Resource Strain: Low Risk  (12/19/2021)   Received from [facility], [facility]   Overall Financial Resource Strain (CARDIA)    Difficulty of Paying Living Expenses: Not very hard  Food Insecurity: No Food Insecurity (12/19/2021)   Received from [facility], [facility]   Hunger Vital Sign    Worried About Running Out of Food in the Last Year: Never true    Ran Out of Food in the Last Year: Never true  Transportation Needs: Not on file  Physical Activity: Sufficiently Active (12/19/2021)   Received from [facility], [facility]   Exercise Vital Sign    Days of Exercise per Week: 5 days    Minutes of Exercise per Session: 60 min  Stress: No Stress Concern Present (12/19/2021)   Received from [facility], [facility]   [employer] of Occupational Health - Occupational Stress Questionnaire    Feeling of Stress : Only a little  Social Connections: Unknown (12/25/2022)   Received from [facility], [facility]   Social Network    Social Network: Not on file   No Known Allergies Family History  Problem Relation Age of Onset   Diabetes Father    Hyperlipidemia Father    Alcohol abuse Paternal Grandmother    Early death Paternal Grandfather    Heart disease Paternal Grandfather  Current Outpatient Medications (Analgesics):    meloxicam (MOBIC) 15 MG tablet, Take 1 tablet (15 mg total) by mouth daily.   Current Outpatient Medications (Other):    zolpidem (AMBIEN) 5 MG tablet, Take 1 tablet (5 mg total) by mouth at bedtime as needed for sleep.   Reviewed prior external information including notes and imaging from  primary care provider As well as notes that were available from care everywhere and other healthcare systems.  Past medical history, social, surgical and family history all reviewed in electronic medical record.  No pertanent information unless stated regarding to the chief complaint.   Review of Systems:  No  headache, visual changes, nausea, vomiting, diarrhea, constipation, dizziness, abdominal pain, skin rash, fevers, chills, night sweats, weight loss, swollen lymph nodes, body aches, joint swelling, chest pain, shortness of breath, mood changes. POSITIVE muscle aches  Objective  Blood pressure (!) 142/86, pulse 67, height 6\' 4"  (1.93 m), weight 208 lb (94.3 kg), SpO2 98%.   General: No apparent distress alert and oriented x3 mood and affect normal, dressed appropriately.  HEENT: Pupils equal, extraocular movements intact  Respiratory: Patient's speak in full sentences and does not appear short of breath  Cardiovascular: No lower extremity edema, non tender, no erythema  Left shoulder exam does still show that there is some weakness noted of the rotator cuff on the left side.  Tenderness with impingement  Limited muscular skeletal ultrasound was performed and interpreted by Antoine Primas, M  Still has some hypoechoic changes still consistent with some partial tearing noted.  Still seems to be more of the supraspinatus. Impression: Minimal change    Impression and Recommendations:    The above documentation has been reviewed and is accurate and complete Judi Saa, DO

## 2023-07-24 ENCOUNTER — Other Ambulatory Visit: Payer: Self-pay

## 2023-07-24 ENCOUNTER — Ambulatory Visit: Payer: BC Managed Care – PPO | Admitting: Family Medicine

## 2023-07-24 ENCOUNTER — Encounter: Payer: Self-pay | Admitting: Family Medicine

## 2023-07-24 VITALS — BP 142/86 | HR 67 | Ht 76.0 in | Wt 208.0 lb

## 2023-07-24 DIAGNOSIS — G8929 Other chronic pain: Secondary | ICD-10-CM

## 2023-07-24 DIAGNOSIS — M25512 Pain in left shoulder: Secondary | ICD-10-CM

## 2023-07-24 DIAGNOSIS — M75112 Incomplete rotator cuff tear or rupture of left shoulder, not specified as traumatic: Secondary | ICD-10-CM | POA: Diagnosis not present

## 2023-07-24 NOTE — Assessment & Plan Note (Signed)
Mild improvement noted.  Would state that 20 to 40% improvement noted when looking with the ultrasound.  Still low has not had significant improvement overall.  Discussed with patient about icing regimen, home exercises, which activities to do and which ones to avoid.  Increase activity slowly otherwise.  Follow-up with me again in 6 to 8 weeks.  Patient wants to continue to try the do the home exercises but if any weakness we will consider the possibility of advanced imaging

## 2023-07-24 NOTE — Patient Instructions (Signed)
See me again in 2 months Meloxicam for 5 days then as needed

## 2023-08-16 ENCOUNTER — Encounter: Payer: Self-pay | Admitting: Internal Medicine

## 2023-09-25 ENCOUNTER — Ambulatory Visit: Payer: BC Managed Care – PPO | Admitting: Family Medicine

## 2023-10-01 NOTE — Progress Notes (Signed)
[facility] [address] [place] [address] Phone: [phone] Subjective:   Marvin Chan, am serving as a scribe for Dr. Antoine Primas.  I'm seeing this patient by the request  of:  Myrlene Broker, MD  CC: Left shoulder pain follow-up  [id number]  07/24/2023 Mild improvement noted.  Would state that 20 to 40% improvement noted when looking with the ultrasound.  Still low has not had significant improvement overall.  Discussed with patient about icing regimen, home exercises, which activities to do and which ones to avoid.  Increase activity slowly otherwise.  Follow-up with me again in 6 to 8 weeks.  Patient wants to continue to try the do the home exercises but if any weakness we will consider the possibility of advanced imaging      Update 10/05/2023 Marvin Chan is a 47 y.o. male coming in with complaint of L shoulder pain. Patient states that his shoulder is doing fine. Feels better than last time we will patient would state that he is feeling 95% better.  Since he has been feeling better though not doing the exercises as regularly.      Past Medical History:  Diagnosis Date   Chest pain    Hyperlipidemia    Past Surgical History:  Procedure Laterality Date   NASAL/SINUS ENDOSCOPY  2011   varicose ligation   1993   Social History   Socioeconomic History   Marital status: Married    Spouse name: Not on file   Number of children: Not on file   Years of education: Not on file   Highest education level: Not on file  Occupational History   Not on file  Tobacco Use   Smoking status: Former   Smokeless tobacco: Never  Vaping Use   Vaping status: Never Used  Substance and Sexual Activity   Alcohol use: Never    Alcohol/week: 0.0 standard drinks of alcohol   Drug use: Never   Sexual activity: Yes  Other Topics Concern   Not on file  Social History Narrative   Not on file   Social Drivers of Health   Financial  Resource Strain: Low Risk  (12/19/2021)   Received from [facility], [facility]   Overall Financial Resource Strain (CARDIA)    Difficulty of Paying Living Expenses: Not very hard  Food Insecurity: No Food Insecurity (12/19/2021)   Received from [facility], [facility]   Hunger Vital Sign    Worried About Running Out of Food in the Last Year: Never true    Ran Out of Food in the Last Year: Never true  Transportation Needs: Not on file  Physical Activity: Sufficiently Active (12/19/2021)   Received from [facility], [facility]   Exercise Vital Sign    Days of Exercise per Week: 5 days    Minutes of Exercise per Session: 60 min  Stress: No Stress Concern Present (12/19/2021)   Received from [facility], [facility]   [employer] of Occupational Health - Occupational Stress Questionnaire    Feeling of Stress : Only a little  Social Connections: Unknown (12/25/2022)   Received from [facility], [facility]   Social Network    Social Network: Not on file   No Known Allergies Family History  Problem Relation Age of Onset   Diabetes Father    Hyperlipidemia Father    Alcohol abuse Paternal Grandmother    Early death Paternal Grandfather    Heart disease Paternal  Grandfather        Current Outpatient Medications (Analgesics):    meloxicam (MOBIC) 15 MG tablet, Take 1 tablet (15 mg total) by mouth daily.   Current Outpatient Medications (Other):    zolpidem (AMBIEN) 5 MG tablet, Take 1 tablet (5 mg total) by mouth at bedtime as needed for sleep.   Objective  Blood pressure 108/72, pulse (!) 52, height 6\' 4"  (1.93 m), weight 211 lb (95.7 kg), SpO2 98%.   General: No apparent distress alert and oriented x3 mood and affect normal, dressed appropriately.  HEENT: Pupils equal, extraocular movements intact  Respiratory: Patient's speak in full sentences and does not appear short of breath  Cardiovascular: No lower extremity edema, non tender, no erythema    Left shoulder exam does have some tenderness to palpation noted.  Improvement in strength but still has some weakness noted minorly when compared to the contralateral side  Limited muscular skeletal ultrasound was performed and interpreted by Antoine Primas, M  Limited ultrasound does show some chronic degenerative changes still noted of the rotator cuff but does seem to have interval improvement from previous exam.  No retraction noted.  Impression: Interval improvement again but still some degenerative rotator cuff   Impression and Recommendations:     The above documentation has been reviewed and is accurate and complete Judi Saa, DO

## 2023-10-05 ENCOUNTER — Other Ambulatory Visit: Payer: Self-pay

## 2023-10-05 ENCOUNTER — Encounter: Payer: Self-pay | Admitting: Family Medicine

## 2023-10-05 ENCOUNTER — Ambulatory Visit: Payer: BC Managed Care – PPO | Admitting: Family Medicine

## 2023-10-05 VITALS — BP 108/72 | HR 52 | Ht 76.0 in | Wt 211.0 lb

## 2023-10-05 DIAGNOSIS — M25512 Pain in left shoulder: Secondary | ICD-10-CM

## 2023-10-05 DIAGNOSIS — G8929 Other chronic pain: Secondary | ICD-10-CM | POA: Diagnosis not present

## 2023-10-05 DIAGNOSIS — M75112 Incomplete rotator cuff tear or rupture of left shoulder, not specified as traumatic: Secondary | ICD-10-CM

## 2023-10-05 NOTE — Assessment & Plan Note (Signed)
Patient on ultrasound still has some signs of some degenerative changes noted.  Does have some underlying arthritic changes as well would discussed.  Patient knows not having any pain that makes him take any medication and nothing that is stopping him from activity.  We discussed different strengthening exercises and encouraged him to continue to be relatively religious on this.  Discussed with patient about home exercises and icing regimen.  Follow-up with me again in 12 weeks otherwise

## 2023-10-11 ENCOUNTER — Encounter: Payer: Self-pay | Admitting: Internal Medicine

## 2023-10-11 MED ORDER — ZOLPIDEM TARTRATE 5 MG PO TABS: 5.0000 mg | ORAL_TABLET | Freq: Every evening | ORAL | 0 refills | Status: DC | PRN

## 2023-10-11 NOTE — Telephone Encounter (Signed)
 I see on patients medication list they asked for it to be removed

## 2023-11-06 ENCOUNTER — Encounter: Payer: BC Managed Care – PPO | Admitting: Internal Medicine

## 2023-11-07 ENCOUNTER — Encounter: Payer: Self-pay | Admitting: Internal Medicine

## 2023-11-07 ENCOUNTER — Ambulatory Visit: Payer: BC Managed Care – PPO | Admitting: Internal Medicine

## 2023-11-07 VITALS — BP 118/80 | HR 56 | Temp 98.8°F | Ht 76.0 in | Wt 210.0 lb

## 2023-11-07 DIAGNOSIS — Z0000 Encounter for general adult medical examination without abnormal findings: Secondary | ICD-10-CM | POA: Diagnosis not present

## 2023-11-07 DIAGNOSIS — E781 Pure hyperglyceridemia: Secondary | ICD-10-CM

## 2023-11-07 DIAGNOSIS — F5101 Primary insomnia: Secondary | ICD-10-CM

## 2023-11-07 DIAGNOSIS — I861 Scrotal varices: Secondary | ICD-10-CM | POA: Diagnosis not present

## 2023-11-07 DIAGNOSIS — Z1159 Encounter for screening for other viral diseases: Secondary | ICD-10-CM

## 2023-11-07 LAB — LIPID PANEL
Cholesterol: 177 mg/dL (ref 0–200)
HDL: 40 mg/dL (ref >39.00)
LDL Cholesterol: 107 mg/dL — ABNORMAL HIGH (ref 0–99)
NonHDL: 136.52
Total CHOL/HDL Ratio: 4
Triglycerides: 149 mg/dL (ref 0.0–149.0)
VLDL: 29.8 mg/dL (ref 0.0–40.0)

## 2023-11-07 LAB — CBC
HCT: 44.2 % (ref 39.0–52.0)
Hemoglobin: 15.1 g/dL (ref 13.0–17.0)
MCHC: 34.3 g/dL (ref 30.0–36.0)
MCV: 91.9 fL (ref 78.0–100.0)
Platelets: 244 10*3/uL (ref 150.0–400.0)
RBC: 4.81 Mil/uL (ref 4.22–5.81)
RDW: 13.1 % (ref 11.5–15.5)
WBC: 4.7 10*3/uL (ref 4.0–10.5)

## 2023-11-07 LAB — COMPREHENSIVE METABOLIC PANEL
ALT: 15 U/L (ref 0–53)
AST: 15 U/L (ref 0–37)
Albumin: 4.6 g/dL (ref 3.5–5.2)
Alkaline Phosphatase: 53 U/L (ref 39–117)
BUN: 10 mg/dL (ref 6–23)
CO2: 28 meq/L (ref 19–32)
Calcium: 9.4 mg/dL (ref 8.4–10.5)
Chloride: 103 meq/L (ref 96–112)
Creatinine, Ser: 0.82 mg/dL (ref 0.40–1.50)
GFR: 103 mL/min (ref >60.00)
Glucose, Bld: 90 mg/dL (ref 70–99)
Potassium: 4.3 meq/L (ref 3.5–5.1)
Sodium: 138 meq/L (ref 135–145)
Total Bilirubin: 1 mg/dL (ref 0.2–1.2)
Total Protein: 6.9 g/dL (ref 6.0–8.3)

## 2023-11-07 LAB — HEPATITIS C ANTIBODY: Hepatitis C Ab: NONREACTIVE

## 2023-11-07 LAB — PSA: PSA: 0.46 ng/mL (ref 0.10–4.00)

## 2023-11-07 NOTE — Assessment & Plan Note (Signed)
Checking lipid panel today and adjust as needed.

## 2023-11-07 NOTE — Assessment & Plan Note (Signed)
Flu shot declines. Tetanus up to date. Cologuard due 2026. Counseled about sun safety and mole surveillance. Counseled about the dangers of distracted driving. Given 10 year screening recommendations.

## 2023-11-07 NOTE — Progress Notes (Signed)
   Subjective:   Patient ID: Marvin Chan, male    DOB: June 26, 1976, 48 y.o.   MRN: [id number]  HPI The patient is here for physical.  PMH, [facility], social history reviewed and updated  Review of Systems  Constitutional: Negative.   HENT: Negative.    Eyes: Negative.   Respiratory:  Negative for cough, chest tightness and shortness of breath.   Cardiovascular:  Negative for chest pain, palpitations and leg swelling.  Gastrointestinal:  Negative for abdominal distention, abdominal pain, constipation, diarrhea, nausea and vomiting.  Musculoskeletal: Negative.   Skin: Negative.   Neurological: Negative.   Psychiatric/Behavioral: Negative.      Objective:  Physical Exam Constitutional:      Appearance: He is well-developed.  HENT:     Head: Normocephalic and atraumatic.  Cardiovascular:     Rate and Rhythm: Normal rate and regular rhythm.  Pulmonary:     Effort: Pulmonary effort is normal. No respiratory distress.     Breath sounds: Normal breath sounds. No wheezing or rales.  Abdominal:     General: Bowel sounds are normal. There is no distension.     Palpations: Abdomen is soft.     Tenderness: There is no abdominal tenderness. There is no rebound.  Musculoskeletal:     Cervical back: Normal range of motion.  Skin:    General: Skin is warm and dry.  Neurological:     Mental Status: He is alert and oriented to person, place, and time.     Coordination: Coordination normal.     Vitals:   11/07/23 0805  BP: 118/80  Pulse: (!) 56  Temp: 98.8 F (37.1 C)  TempSrc: Oral  SpO2: 98%  Weight: 210 lb (95.3 kg)  Height: 6\' 4"  (1.93 m)    Assessment & Plan:

## 2023-11-07 NOTE — Assessment & Plan Note (Signed)
He will see urology if desired, not growing significantly since last year.

## 2023-11-07 NOTE — Assessment & Plan Note (Signed)
Stable and rarely uses [place] mostly while traveling. Okay to refill when needed.

## 2023-11-27 DIAGNOSIS — Z9889 Other specified postprocedural states: Secondary | ICD-10-CM | POA: Diagnosis not present

## 2023-11-27 DIAGNOSIS — J341 Cyst and mucocele of nose and nasal sinus: Secondary | ICD-10-CM | POA: Diagnosis not present

## 2023-11-27 DIAGNOSIS — J343 Hypertrophy of nasal turbinates: Secondary | ICD-10-CM | POA: Diagnosis not present

## 2023-11-27 DIAGNOSIS — J342 Deviated nasal septum: Secondary | ICD-10-CM | POA: Diagnosis not present

## 2023-11-27 DIAGNOSIS — J3489 Other specified disorders of nose and nasal sinuses: Secondary | ICD-10-CM | POA: Diagnosis not present

## 2024-01-03 ENCOUNTER — Ambulatory Visit: Payer: BC Managed Care – PPO | Admitting: Family Medicine

## 2024-02-23 ENCOUNTER — Encounter: Payer: Self-pay | Admitting: Internal Medicine

## 2024-02-25 MED ORDER — ZOLPIDEM TARTRATE 5 MG PO TABS: 5.0000 mg | ORAL_TABLET | Freq: Every evening | ORAL | 0 refills | Status: AC | PRN

## 2024-03-10 ENCOUNTER — Ambulatory Visit: Admitting: Family Medicine

## 2024-03-10 ENCOUNTER — Encounter: Payer: Self-pay | Admitting: Family Medicine

## 2024-03-10 ENCOUNTER — Ambulatory Visit

## 2024-03-10 ENCOUNTER — Ambulatory Visit (INDEPENDENT_AMBULATORY_CARE_PROVIDER_SITE_OTHER)

## 2024-03-10 VITALS — BP 138/92 | HR 83 | Ht 76.0 in | Wt 205.0 lb

## 2024-03-10 DIAGNOSIS — S39012A Strain of muscle, fascia and tendon of lower back, initial encounter: Secondary | ICD-10-CM | POA: Insufficient documentation

## 2024-03-10 DIAGNOSIS — M545 Low back pain, unspecified: Secondary | ICD-10-CM | POA: Diagnosis not present

## 2024-03-10 DIAGNOSIS — M9903 Segmental and somatic dysfunction of lumbar region: Secondary | ICD-10-CM | POA: Insufficient documentation

## 2024-03-10 NOTE — Progress Notes (Signed)
 [facility] [address] [place] [address] Phone: [phone] Subjective:    I'm seeing this patient by the request  of:  Adelia Homestead, MD  CC: Low back pain follow-up  [id number]  Marvin Chan is a 48 y.o. male coming in with complaint of lumbar spine pain. Patient states that he feels like he might have strained lower L lumbar spine doing hot yoga 4 weeks ago. Used meloxicam  for 5 days. Slowly seems to be improving. Most painful to perform lumbar flexion. Denies any radiating symptoms.       Past Medical History:  Diagnosis Date   Chest pain    Hyperlipidemia    Past Surgical History:  Procedure Laterality Date   NASAL/SINUS ENDOSCOPY  2011   varicose ligation   1993   Social History   Socioeconomic History   Marital status: Married    Spouse name: Not on file   Number of children: Not on file   Years of education: Not on file   Highest education level: Bachelor's degree (e.g., BA, AB, BS)  Occupational History   Not on file  Tobacco Use   Smoking status: Former   Smokeless tobacco: Never  Vaping Use   Vaping status: Never Used  Substance and Sexual Activity   Alcohol use: Never    Alcohol/week: 0.0 standard drinks of alcohol   Drug use: Never   Sexual activity: Yes  Other Topics Concern   Not on file  Social History Narrative   Not on file   Social Drivers of Health   Financial Resource Strain: Low Risk  (11/03/2023)   Overall Financial Resource Strain (CARDIA)    Difficulty of Paying Living Expenses: Not very hard  Food Insecurity: No Food Insecurity (11/03/2023)   Hunger Vital Sign    Worried About Running Out of Food in the Last Year: Never true    Ran Out of Food in the Last Year: Never true  Transportation Needs: No Transportation Needs (11/03/2023)   PRAPARE - Administrator, Civil Service (Medical): No    Lack of Transportation (Non-Medical): No  Physical Activity: Sufficiently Active  (11/03/2023)   Exercise Vital Sign    Days of Exercise per Week: 5 days    Minutes of Exercise per Session: 50 min  Stress: No Stress Concern Present (11/03/2023)   [employer] of Occupational Health - Occupational Stress Questionnaire    Feeling of Stress : Not at all  Social Connections: Socially Integrated (11/03/2023)   Social Connection and Isolation Panel [NHANES]    Frequency of Communication with Friends and Family: Once a week    Frequency of Social Gatherings with Friends and Family: Three times a week    Attends Religious Services: More than 4 times per year    Active Member of Clubs or Organizations: Yes    Attends Engineer, structural: More than 4 times per year    Marital Status: Married   No Known Allergies Family History  Problem Relation Age of Onset   Diabetes Father    Hyperlipidemia Father    Alcohol abuse Paternal Grandmother    Early death Paternal Grandfather    Heart disease Paternal Grandfather        Current Outpatient Medications (Analgesics):    meloxicam  (MOBIC ) 15 MG tablet, Take 1 tablet (15 mg total) by mouth daily.   Current Outpatient Medications (Other):    zolpidem  (AMBIEN ) 5 MG tablet, Take 1 tablet (5 mg  total) by mouth at bedtime as needed for sleep.   Reviewed prior external information including notes and imaging from  primary care provider As well as notes that were available from care everywhere and other healthcare systems.  Past medical history, social, surgical and family history all reviewed in electronic medical record.  No pertanent information unless stated regarding to the chief complaint.   Review of Systems:  No headache, visual changes, nausea, vomiting, diarrhea, constipation, dizziness, abdominal pain, skin rash, fevers, chills, night sweats, weight loss, swollen lymph nodes, body aches, joint swelling, chest pain, shortness of breath, mood changes. POSITIVE muscle aches  Objective  Blood pressure (!)  138/92, pulse 83, height 6\' 4"  (1.93 m), weight 205 lb (93 kg), SpO2 96%.   General: No apparent distress alert and oriented x3 mood and affect normal, dressed appropriately.  HEENT: Pupils equal, extraocular movements intact  Respiratory: Patient's speak in full sentences and does not appear short of breath  Cardiovascular: No lower extremity edema, non tender, no erythema  Low back exam does have some loss of lordosis.  Some worsening pain with some mild straight leg test.  Worsening pain with full extension of the back on the left side.  Negative Trendelenburg.  Negative straight leg test though noted with radicular symptoms.  5 out of 5 strength of the lower extremities.  Osteopathic findings L3 flexed rotated and side bent left Sacrum left on left  [id number]; 15 additional minutes spent for Therapeutic exercises as stated in above notes.  This included exercises focusing on stretching, strengthening, with significant focus on eccentric aspects.   Long term goals include an improvement in range of motion, strength, endurance as well as avoiding reinjury. Patient's frequency would include in 1-2 times a day, 3-5 times a week for a duration of 6-12 weeks. Low back exercises that included:  Pelvic tilt/bracing instruction to focus on control of the pelvic girdle and lower abdominal muscles  Glute strengthening exercises, focusing on proper firing of the glutes without engaging the low back muscles Proper stretching techniques for maximum relief for the hamstrings, hip flexors, low back and some rotation where tolerated  Proper technique shown and discussed handout in great detail with ATC.  All questions were discussed and answered.     Impression and Recommendations:     The above documentation has been reviewed and is accurate and complete Marvin Chan M Marvin Sabo, DO

## 2024-03-10 NOTE — Assessment & Plan Note (Addendum)
 Patient having more pain with the repetitive extension of the back.  Initially it was sacroiliac joint but seems to be more secondary to facet injury.  Discussed icing regimen and home exercises, which activities to do and which ones to avoid.  Discussed icing regimen and home exercises.  Work with Event organiser to learn home exercises.  Had some osteopathic manipulation which did seem to help.  Increase activity slowly otherwise.  Follow-up again in 6 to 8 weeks.

## 2024-03-10 NOTE — Patient Instructions (Addendum)
 Xray today Tried osteopathic manipulation today Exercises Try not to win yoga See me in 2 months

## 2024-03-10 NOTE — Assessment & Plan Note (Signed)
   Decision today to treat with OMT was based on Physical Exam  After verbal consent patient was treated with HVLA, ME, techniques in  lumbar and sacral areas, Patient tolerated the procedure well with improvement in symptoms  Patient given exercises, stretches and lifestyle modifications  See medications in patient instructions if given  Patient will follow up in 4-8 weeks

## 2024-03-17 ENCOUNTER — Ambulatory Visit: Payer: Self-pay | Admitting: Family Medicine

## 2024-04-07 ENCOUNTER — Ambulatory Visit: Admitting: Family Medicine

## 2024-04-29 NOTE — Progress Notes (Unsigned)
  [facility] [address] [place] [address] Phone: [phone] Subjective:   ISusannah Chan, am serving as a scribe for Dr. Arthea Claudene.  I'm seeing this patient by the request  of:  Rollene Almarie LABOR, MD  CC: back and neck pain follow up   [name]  Marvin Chan is a 48 y.o. male coming in with complaint of back and neck pain. OMT 03/10/2024. Patient states doing well today. Having no pain.  Medications patient has been prescribed: None  Taking:     Xray 03/10/2024 lumbar   IMPRESSION: 1. Negative lumbar spine radiographs. 2. Mild spondylosis and disc space height loss at T11-T12.    Reviewed prior external information including notes and imaging from previsou exam, outside providers and external EMR if available.   As well as notes that were available from care everywhere and other healthcare systems.  Past medical history, social, surgical and family history all reviewed in electronic medical record.  No pertanent information unless stated regarding to the chief complaint.   Past Medical History:  Diagnosis Date   Chest pain    Hyperlipidemia     No Known Allergies    Objective  Blood pressure 118/74, pulse 75, height 6' 4 (1.93 m), weight 199 lb (90.3 kg), SpO2 98%.   General: No apparent distress alert and oriented x3 mood and affect normal, dressed appropriately.  HEENT: Pupils equal, extraocular movements intact  Respiratory: Patient's speak in full sentences and does not appear short of breath  Cardiovascular: No lower extremity edema, non tender, no erythema  Gait normal MSK:  Back does have some loss of lordosis very minorly.  The patient's pain seems to be though more in the left parascapular area.  Mild scapular dyskinesis noted.  Mild tightness noted of the left side of the neck.  Osteopathic findings C6 flexed rotated and side bent left T6 extended rotated and side bent left inhaled rib      Assessment and Plan:  No problem-specific Assessment & Plan notes found for this encounter.    Nonallopathic problems  Decision today to treat with OMT was based on Physical Exam  After verbal consent patient was treated with HVLA, ME, FPR techniques in cervical, rib, thoracic  areas  Patient tolerated the procedure well with improvement in symptoms  Patient given exercises, stretches and lifestyle modifications  See medications in patient instructions if given  Patient will follow up in 16 weeks    The above documentation has been reviewed and is accurate and complete Valencia Kassa M Monaye Blackie, DO          Note: This dictation was prepared with Dragon dictation along with smaller phrase technology. Any transcriptional errors that result from this process are unintentional.

## 2024-04-30 ENCOUNTER — Encounter: Payer: Self-pay | Admitting: Family Medicine

## 2024-04-30 ENCOUNTER — Ambulatory Visit: Admitting: Family Medicine

## 2024-04-30 VITALS — BP 118/74 | HR 75 | Ht 76.0 in | Wt 199.0 lb

## 2024-04-30 DIAGNOSIS — M9908 Segmental and somatic dysfunction of rib cage: Secondary | ICD-10-CM

## 2024-04-30 DIAGNOSIS — G2589 Other specified extrapyramidal and movement disorders: Secondary | ICD-10-CM | POA: Diagnosis not present

## 2024-04-30 DIAGNOSIS — M9901 Segmental and somatic dysfunction of cervical region: Secondary | ICD-10-CM

## 2024-04-30 DIAGNOSIS — M9902 Segmental and somatic dysfunction of thoracic region: Secondary | ICD-10-CM | POA: Diagnosis not present

## 2024-04-30 NOTE — Assessment & Plan Note (Signed)
 More upper neck than lower neck.  Discussed with patient's about which activities to do in which ones to avoid.  Patient did do significant amount of hiking with a heavy backpack and still doing very well.  Increase activity slowly otherwise.  Follow-up again in 6 to 8 weeks.

## 2024-04-30 NOTE — Patient Instructions (Signed)
 Look into [employer] Overall doing great See you again in 4 months

## 2024-06-04 ENCOUNTER — Ambulatory Visit: Attending: Cardiovascular Disease | Admitting: Cardiovascular Disease

## 2024-06-04 ENCOUNTER — Ambulatory Visit: Admitting: Cardiovascular Disease

## 2024-06-04 ENCOUNTER — Ambulatory Visit (HOSPITAL_COMMUNITY)
Admission: RE | Admit: 2024-06-04 | Discharge: 2024-06-04 | Disposition: A | Discharge status: Home / Self Care | Payer: Self-pay | Source: Ambulatory Visit | Attending: Cardiology | Admitting: Cardiology

## 2024-06-04 ENCOUNTER — Encounter: Payer: Self-pay | Admitting: Cardiovascular Disease

## 2024-06-04 VITALS — BP 100/60 | HR 56 | Ht 76.0 in | Wt 200.0 lb

## 2024-06-04 DIAGNOSIS — E781 Pure hyperglyceridemia: Secondary | ICD-10-CM

## 2024-06-04 NOTE — Progress Notes (Signed)
 06/04/2024 Marvin DILORENZO   11-29-1975  [id number]  Primary Physician Rollene Almarie LABOR, MD Primary Cardiologist: Dorn JINNY Lesches MD [place] [place], [place]  HPI:  Marvin Chan is a 48 y.o.   mildly overweight married Caucasian male father of one 48-1/2-year-old daughter who is in ninth grade.  He was initially referred by Dr. Richard Keever for cardiovascular evaluation because of recent onset of chest pain.  I last saw him in the office 04/09/2020.  He works in Nurse, children's. His cardiovascular risk factor profile is only notable for mild hyperlipidemia but otherwise is negative. He does have several uncles who have had ischemic heart disease but no first-degree relative.  His paternal grandfather died of a myocardial infarction age 60.  He recently developed atypical chest pain while riding his bicycle. He is an avid bicyclist and has had some chest pain since that time without radiation or associated symptoms.  I performed routine GXT on him 02/24/2015 which was entirely normal.   Since I saw him in the office 4 years ago he has done well.  I did not 2D echo on him 05/04/2020 which was essentially normal.  He stopped drinking and is lost 20 pounds.  He does yoga and cycling.  He denies chest pain or shortness of breath.   Current Meds  Medication Sig   zolpidem  (AMBIEN ) 5 MG tablet Take 1 tablet (5 mg total) by mouth at bedtime as needed for sleep.     No Known Allergies  Social History   Socioeconomic History   Marital status: Married    Spouse name: Not on file   Number of children: Not on file   Years of education: Not on file   Highest education level: Bachelor's degree (e.g., BA, AB, BS)  Occupational History   Not on file  Tobacco Use   Smoking status: Former   Smokeless tobacco: Never  Vaping Use   Vaping status: Never Used  Substance and Sexual Activity   Alcohol use: Never    Alcohol/week: 0.0 standard drinks of alcohol   Drug use: Never   Sexual  activity: Yes  Other Topics Concern   Not on file  Social History Narrative   Not on file   Social Drivers of Health   Financial Resource Strain: Low Risk  (11/03/2023)   Overall Financial Resource Strain (CARDIA)    Difficulty of Paying Living Expenses: Not very hard  Food Insecurity: No Food Insecurity (11/03/2023)   Hunger Vital Sign    Worried About Running Out of Food in the Last Year: Never true    Ran Out of Food in the Last Year: Never true  Transportation Needs: No Transportation Needs (11/03/2023)   PRAPARE - Administrator, Civil Service (Medical): No    Lack of Transportation (Non-Medical): No  Physical Activity: Sufficiently Active (11/03/2023)   Exercise Vital Sign    Days of Exercise per Week: 5 days    Minutes of Exercise per Session: 50 min  Stress: No Stress Concern Present (11/03/2023)   [employer] of Occupational Health - Occupational Stress Questionnaire    Feeling of Stress : Not at all  Social Connections: Socially Integrated (11/03/2023)   Social Connection and Isolation Panel    Frequency of Communication with Friends and Family: Once a week    Frequency of Social Gatherings with Friends and Family: Three times a week    Attends Religious Services: More than 4 times per year  Active Member of Clubs or Organizations: Yes    Attends Banker Meetings: More than 4 times per year    Marital Status: Married  Intimate Partner Violence: Unknown (12/25/2022)   Received from [facility]   HITS    Physically Hurt: Not on file    Insult or Talk Down To: Not on file    Threaten Physical Harm: Not on file    Scream or Curse: Not on file     Review of Systems: General: negative for chills, fever, night sweats or weight changes.  Cardiovascular: negative for chest pain, dyspnea on exertion, edema, orthopnea, palpitations, paroxysmal nocturnal dyspnea or shortness of breath Dermatological: negative for rash Respiratory: negative for  cough or wheezing Urologic: negative for hematuria Abdominal: negative for nausea, vomiting, diarrhea, bright red blood per rectum, melena, or hematemesis Neurologic: negative for visual changes, syncope, or dizziness All other systems reviewed and are otherwise negative except as noted above.    Blood pressure 100/60, pulse (!) 56, height 6' 4 (1.93 m), weight 200 lb (90.7 kg), SpO2 98%.  General appearance: alert and no distress Neck: no adenopathy, no carotid bruit, no JVD, supple, symmetrical, trachea midline, and thyroid not enlarged, symmetric, no tenderness/mass/nodules Lungs: clear to auscultation bilaterally Heart: regular rate and rhythm, S1, S2 normal, no murmur, click, rub or gallop Extremities: extremities normal, atraumatic, no cyanosis or edema Pulses: 2+ and symmetric Skin: Skin color, texture, turgor normal. 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  He is not on statin therapy.  I am going to get a coronary calcium  score to her stratify.     Carrier DOROTHA Court MD FACP,FACC,FAHA, [facility] 06/04/2024 11:35 AM

## 2024-06-04 NOTE — Assessment & Plan Note (Signed)
 History of mild hyperlipidemia with lipid profile performed 11/07/2023 revealing total cholesterol 177, LDL 107 and HDL 40.  He is not on statin therapy.  I am going to get a coronary calcium  score to her stratify.

## 2024-06-04 NOTE — Patient Instructions (Addendum)
 Medication Instructions:  Your physician recommends that you continue on your current medications as directed. Please refer to the Current Medication list given to you today.  *If you need a refill on your cardiac medications before your next appointment, please call your pharmacy*   Testing/Procedures: Dr. Court has ordered a CT coronary calcium  score.   Test locations:  [facility]  [facility] at [facility]  This is $99 out of pocket.   Coronary CalciumScan A coronary calcium  scan is an imaging test used to look for deposits of calcium  and other fatty materials (plaques) in the inner lining of the blood vessels of the heart (coronary arteries). These deposits of calcium  and plaques can partly clog and narrow the coronary arteries without producing any symptoms or warning signs. This puts a person at risk for a heart attack. This test can detect these deposits before symptoms develop. Tell a health care provider about: Any allergies you have. All medicines you are taking, including vitamins, herbs, eye drops, creams, and over-the-counter medicines. Any problems you or family members have had with anesthetic medicines. Any blood disorders you have. Any surgeries you have had. Any medical conditions you have. Whether you are pregnant or may be pregnant. What are the risks? Generally, this is a safe procedure. However, problems may occur, including: Harm to a pregnant woman and her unborn baby. This test involves the use of radiation. Radiation exposure can be dangerous to a pregnant woman and her unborn baby. If you are pregnant, you generally should not have this procedure done. Slight increase in the risk of cancer. This is because of the radiation involved in the test. What happens before the procedure? No preparation is needed for this procedure. What happens  during the procedure? You will undress and remove any jewelry around your neck or chest. You will put on a hospital gown. Sticky electrodes will be placed on your chest. The electrodes will be connected to an electrocardiogram (ECG) machine to record a tracing of the electrical activity of your heart. A CT scanner will take pictures of your heart. During this time, you will be asked to lie still and hold your breath for 2-3 seconds while a picture of your heart is being taken. The procedure may vary among health care providers and hospitals. What happens after the procedure? You can get dressed. You can return to your normal activities. It is up to you to get the results of your test. Ask your health care provider, or the department that is doing the test, when your results will be ready. Summary A coronary calcium  scan is an imaging test used to look for deposits of calcium  and other fatty materials (plaques) in the inner lining of the blood vessels of the heart (coronary arteries). Generally, this is a safe procedure. Tell your health care provider if you are pregnant or may be pregnant. No preparation is needed for this procedure. A CT scanner will take pictures of your heart. You can return to your normal activities after the scan is done. This information is not intended to replace advice given to you by your health care provider. Make sure you discuss any questions you have with your health care provider. Document Released: 03/23/2008 Document Revised: 08/14/2016 Document Reviewed: 08/14/2016 Elsevier Interactive Patient Education  2017 [employer].   Follow-Up: At [facility], you and your health needs are our priority.  As part of our continuing mission to provide you with exceptional heart care, our providers are all part of one team.  This team includes your primary Cardiologist (physician) and Advanced Practice Providers or APPs (Physician Assistants and Nurse  Practitioners) who all work together to provide you with the care you need, when you need it.  Your next appointment:   18 month(s)  Provider:   Dorn Lesches, MD

## 2024-06-05 ENCOUNTER — Ambulatory Visit: Payer: Self-pay | Admitting: Cardiovascular Disease

## 2024-06-05 DIAGNOSIS — R931 Abnormal findings on diagnostic imaging of heart and coronary circulation: Secondary | ICD-10-CM

## 2024-06-05 DIAGNOSIS — E781 Pure hyperglyceridemia: Secondary | ICD-10-CM

## 2024-06-06 ENCOUNTER — Encounter: Payer: Self-pay | Admitting: Cardiovascular Disease

## 2024-06-06 ENCOUNTER — Other Ambulatory Visit: Payer: Self-pay

## 2024-06-06 MED ORDER — ROSUVASTATIN CALCIUM 5 MG PO TABS: 5.0000 mg | ORAL_TABLET | ORAL | 3 refills | Status: DC

## 2024-06-27 ENCOUNTER — Ambulatory Visit: Admitting: Plastic Surgery

## 2024-06-27 ENCOUNTER — Encounter: Payer: Self-pay | Admitting: Plastic Surgery

## 2024-06-27 VITALS — BP 125/82 | HR 56 | Ht 76.0 in | Wt 194.4 lb

## 2024-06-27 DIAGNOSIS — R220 Localized swelling, mass and lump, head: Secondary | ICD-10-CM | POA: Diagnosis not present

## 2024-06-27 DIAGNOSIS — M898X8 Other specified disorders of bone, other site: Secondary | ICD-10-CM | POA: Insufficient documentation

## 2024-06-27 NOTE — Progress Notes (Signed)
     Patient ID: Marvin Chan, male    DOB: 1975/12/06, 48 y.o.   MRN: [id number]   Chief Complaint  Patient presents with   consult    The patient is a 48 year old male here for evaluation of his scalp.  He is 6 feet 4 inches tall and weighs 194 pounds.  He has an area that is about 2 cm on his right forehead.  It is raised and feels like it has a combination of a firm area that may be an osteoma and a fatty area that may be a lipoma.  It has been there for several years.  It is getting a little bigger.  He is concerned about the increase in size.  He is otherwise in good health and keeps his cholesterol under control with medication.    Review of Systems  Constitutional: Negative.   Eyes: Negative.   Respiratory: Negative.    Cardiovascular: Negative.   Gastrointestinal: Negative.   Endocrine: Negative.   Genitourinary: Negative.   Musculoskeletal: Negative.     Past Medical History:  Diagnosis Date   Chest pain    Hyperlipidemia     Past Surgical History:  Procedure Laterality Date   NASAL/SINUS ENDOSCOPY  2011   varicose ligation   1993      Current Outpatient Medications:    rosuvastatin  (CRESTOR ) 5 MG tablet, Take 1 tablet (5 mg total) by mouth every other day., Disp: 45 tablet, Rfl: 3   zolpidem  (AMBIEN ) 5 MG tablet, Take 1 tablet (5 mg total) by mouth at bedtime as needed for sleep., Disp: 10 tablet, Rfl: 0   Objective:   Vitals:   06/27/24 0907  BP: 125/82  Pulse: (!) 56  SpO2: 99%    Physical Exam Vitals reviewed.  HENT:     Head: Normocephalic.   Cardiovascular:     Rate and Rhythm: Normal rate.     Pulses: Normal pulses.  Pulmonary:     Effort: Pulmonary effort is normal.  Skin:    General: Skin is warm.     Coloration: Skin is not jaundiced.     Findings: Lesion present. No bruising.  Neurological:     Mental Status: He is oriented to person, place, and time.  Psychiatric:        Mood and Affect: Mood normal.        Behavior: Behavior  normal.        Thought Content: Thought content normal.        Judgment: Judgment normal.     Assessment & Plan:  Mass of skull  Plan for excision in the OR because it may have a bony component to it.  Plan for excision of right forehead mass.  Pictures were obtained of the patient and placed in the chart with the patient's or guardian's permission.   Marvin RAMAN Marcellius Montagna, DO

## 2024-07-24 ENCOUNTER — Encounter: Payer: Self-pay | Admitting: Plastic Surgery

## 2024-07-24 NOTE — Addendum Note (Signed)
 Addended by: LOWERY ESTEFANA RAMAN on: 07/24/2024 02:13 PM   Modules accepted: Orders

## 2024-07-24 NOTE — Telephone Encounter (Signed)
 Hey, I reviewed consult note.  I do not see a route, but was not sure if it was sent another way.

## 2024-08-28 NOTE — Progress Notes (Signed)
  [facility] [address] [place] [address] Phone: [phone] Subjective:   ISusannah Chan, am serving as a scribe for Dr. Arthea Claudene.  I'm seeing this patient by the request  of:  Marvin Almarie LABOR, MD  CC: Back and neck pain follow-up  [name]  Marvin Chan is a 48 y.o. male coming in with complaint of back and neck pain. OMT 04/30/2024. Referred and seen by plastic surgery for mass on skull. Patient states doing well. No issues today. Just checking in.  Medications patient has been prescribed: None  Taking:      CT calcium  score of 20.3   Reviewed prior external information including notes and imaging from previsou exam, outside providers and external EMR if available.   As well as notes that were available from care everywhere and other healthcare systems.  Past medical history, social, surgical and family history all reviewed in electronic medical record.  No pertanent information unless stated regarding to the chief complaint.   Past Medical History:  Diagnosis Date   Chest pain    Hyperlipidemia     No Known Allergies   Review of Systems:  No headache, visual changes, nausea, vomiting, diarrhea, constipation, dizziness, abdominal pain, skin rash, fevers, chills, night sweats, weight loss, swollen lymph nodes, body aches, joint swelling, chest pain, shortness of breath, mood changes. POSITIVE muscle aches  Objective  Blood pressure 120/76, pulse 68, height 6' 4 (1.93 m), weight 200 lb (90.7 kg), SpO2 98%.   General: No apparent distress alert and oriented x3 mood and affect normal, dressed appropriately.  HEENT: Pupils equal, extraocular movements intact  Respiratory: Patient's speak in full sentences and does not appear short of breath  Cardiovascular: No lower extremity edema, non tender, no erythema  Very mild tightness noted around the right sacroiliac joint.  Mild positive FABER test right greater than  left.  Negative straight leg test noted.  Osteopathic findings  C2 flexed rotated and side bent right C6 flexed rotated and side bent left T3 extended rotated and side bent right inhaled rib T9 extended rotated and side bent left L2 flexed rotated and side bent right Sacrum right on right       Assessment and Plan:  Lumbar strain, initial encounter Responding fairly well.  Still has some tightness noted in the paraspinal musculature on the right side and some scapular dyskinesis.  Nothing of that is stopping him from activity.  After further evaluation patient agreed to try osteopathic manipulation.  Discussed icing regimen and home exercises, increase activity slowly.  Discussed icing regimen.  Follow-up again in 6 to 12 weeks    Nonallopathic problems  Decision today to treat with OMT was based on Physical Exam  After verbal consent patient was treated with HVLA, ME, FPR techniques in cervical, rib, thoracic, lumbar, and sacral  areas  Patient tolerated the procedure well with improvement in symptoms  Patient given exercises, stretches and lifestyle modifications  See medications in patient instructions if given  Patient will follow up in 4-8 weeks      The above documentation has been reviewed and is accurate and complete Linette Gunderson M Page Pucciarelli, DO        Note: This dictation was prepared with Dragon dictation along with smaller phrase technology. Any transcriptional errors that result from this process are unintentional.

## 2024-09-02 ENCOUNTER — Encounter: Payer: Self-pay | Admitting: Family Medicine

## 2024-09-02 ENCOUNTER — Ambulatory Visit: Admitting: Family Medicine

## 2024-09-02 VITALS — BP 120/76 | HR 68 | Ht 76.0 in | Wt 200.0 lb

## 2024-09-02 DIAGNOSIS — S61218A Laceration without foreign body of other finger without damage to nail, initial encounter: Secondary | ICD-10-CM | POA: Diagnosis not present

## 2024-09-02 DIAGNOSIS — M9901 Segmental and somatic dysfunction of cervical region: Secondary | ICD-10-CM

## 2024-09-02 DIAGNOSIS — M9908 Segmental and somatic dysfunction of rib cage: Secondary | ICD-10-CM

## 2024-09-02 DIAGNOSIS — S39012A Strain of muscle, fascia and tendon of lower back, initial encounter: Secondary | ICD-10-CM

## 2024-09-02 DIAGNOSIS — M9902 Segmental and somatic dysfunction of thoracic region: Secondary | ICD-10-CM

## 2024-09-02 DIAGNOSIS — M9904 Segmental and somatic dysfunction of sacral region: Secondary | ICD-10-CM | POA: Diagnosis not present

## 2024-09-02 DIAGNOSIS — M9903 Segmental and somatic dysfunction of lumbar region: Secondary | ICD-10-CM | POA: Diagnosis not present

## 2024-09-02 MED ORDER — DOXYCYCLINE HYCLATE 100 MG PO TABS: 100.0000 mg | ORAL_TABLET | Freq: Two times a day (BID) | ORAL | 0 refills | Status: DC

## 2024-09-02 NOTE — Patient Instructions (Addendum)
Doxycycline 100 mg 2 x a da y for 7 days

## 2024-09-02 NOTE — Assessment & Plan Note (Signed)
 No sign of any infectious etiology but given doxycycline  with patient going into a holiday a week.

## 2024-09-02 NOTE — Assessment & Plan Note (Signed)
 Responding fairly well.  Still has some tightness noted in the paraspinal musculature on the right side and some scapular dyskinesis.  Nothing of that is stopping him from activity.  After further evaluation patient agreed to try osteopathic manipulation.  Discussed icing regimen and home exercises, increase activity slowly.  Discussed icing regimen.  Follow-up again in 6 to 12 weeks

## 2024-09-15 ENCOUNTER — Encounter: Payer: Self-pay | Admitting: Student

## 2024-09-15 ENCOUNTER — Telehealth (HOSPITAL_BASED_OUTPATIENT_CLINIC_OR_DEPARTMENT_OTHER): Payer: Self-pay

## 2024-09-15 ENCOUNTER — Ambulatory Visit: Admitting: Student

## 2024-09-15 ENCOUNTER — Telehealth (HOSPITAL_BASED_OUTPATIENT_CLINIC_OR_DEPARTMENT_OTHER): Payer: Self-pay | Admitting: *Deleted

## 2024-09-15 VITALS — BP 122/81 | HR 57 | Ht 76.0 in | Wt 198.0 lb

## 2024-09-15 DIAGNOSIS — R220 Localized swelling, mass and lump, head: Secondary | ICD-10-CM

## 2024-09-15 DIAGNOSIS — M898X8 Other specified disorders of bone, other site: Secondary | ICD-10-CM

## 2024-09-15 MED ORDER — ONDANSETRON HCL 4 MG PO TABS: 4.0000 mg | ORAL_TABLET | Freq: Three times a day (TID) | ORAL | 0 refills | Status: AC | PRN

## 2024-09-15 MED ORDER — CEPHALEXIN 500 MG PO CAPS: 500.0000 mg | ORAL_CAPSULE | Freq: Four times a day (QID) | ORAL | 0 refills | Status: AC

## 2024-09-15 NOTE — Telephone Encounter (Signed)
 Primary Cardiologist:None   Preoperative team, please contact this patient and set up a phone call appointment for further preoperative risk assessment. Please obtain consent and complete medication review. Thank you for your help.   I confirm that guidance regarding antiplatelet and oral anticoagulation therapy has been completed and, if necessary, noted below (none requested).   I also confirmed the patient resides in the state of [place] . As per [facility] Medical Board telemedicine laws, the patient must reside in the state in which the provider is licensed.   Rosaline EMERSON Bane, NP-C  09/15/2024, 3:22 PM [address], [address] [place], [place] [address] Office [phone] Fax [phone]

## 2024-09-15 NOTE — Telephone Encounter (Signed)
 Pt has been scheduled tele preop appt 09/23/24. Med rec and consent are done.      Patient Consent for Virtual Visit        Marvin Chan has provided verbal consent on 09/15/2024 for a virtual visit (video or telephone).   CONSENT FOR VIRTUAL VISIT FOR:  Marvin Chan  By participating in this virtual visit I agree to the following:  I hereby voluntarily request, consent and authorize [facility] and its employed or contracted physicians, physician assistants, nurse practitioners or other licensed health care professionals (the Practitioner), to provide me with telemedicine health care services (the "Services) as deemed necessary by the treating Practitioner. I acknowledge and consent to receive the Services by the Practitioner via telemedicine. I understand that the telemedicine visit will involve communicating with the Practitioner through live audiovisual communication technology and the disclosure of certain medical information by electronic transmission. I acknowledge that I have been given the opportunity to request an in-person assessment or other available alternative prior to the telemedicine visit and am voluntarily participating in the telemedicine visit.  I understand that I have the right to withhold or withdraw my consent to the use of telemedicine in the course of my care at any time, without affecting my right to future care or treatment, and that the Practitioner or I may terminate the telemedicine visit at any time. I understand that I have the right to inspect all information obtained and/or recorded in the course of the telemedicine visit and may receive copies of available information for a reasonable fee.  I understand that some of the potential risks of receiving the Services via telemedicine include:  Delay or interruption in medical evaluation due to technological equipment failure or disruption; Information transmitted may not be sufficient (e.g. poor resolution  of images) to allow for appropriate medical decision making by the Practitioner; and/or  In rare instances, security protocols could fail, causing a breach of personal health information.  Furthermore, I acknowledge that it is my responsibility to provide information about my medical history, conditions and care that is complete and accurate to the best of my ability. I acknowledge that Practitioner's advice, recommendations, and/or decision may be based on factors not within their control, such as incomplete or inaccurate data provided by me or distortions of diagnostic images or specimens that may result from electronic transmissions. I understand that the practice of medicine is not an exact science and that Practitioner makes no warranties or guarantees regarding treatment outcomes. I acknowledge that a copy of this consent can be made available to me via my patient portal [facility] MyChart), or I can request a printed copy by calling the office of  [facility].    I understand that my insurance will be billed for this visit.   I have read or had this consent read to me. I understand the contents of this consent, which adequately explains the benefits and risks of the Services being provided via telemedicine.  I have been provided ample opportunity to ask questions regarding this consent and the Services and have had my questions answered to my satisfaction. I give my informed consent for the services to be provided through the use of telemedicine in my medical care

## 2024-09-15 NOTE — Progress Notes (Addendum)
 Patient ID: Marvin Chan, male    DOB: September 06, 1976, 48 y.o.   MRN: [id number]  Chief Complaint  Patient presents with   Pre-op Exam      ICD-10-CM   1. Mass of skull  M89.8X8        History of Present Illness: Marvin Chan is a 48 y.o.  male  with a history of skull mass.  He presents for preoperative evaluation for upcoming procedure, excision of forehead skull mass, scheduled for 09/29/24 with Dr. Lowery.  The patient has not had problems with anesthesia.  Patient denies any personal history of cardiac issues.  He states that he does see a cardiologist due to family history.  He states that his grandfather passed away from MI at the age of 66.  He denies taking any blood thinners.  Patient reports he is not a smoker.  Patient denies taking any hormone replacement.  He denies any personal family history of blood clots or clotting diseases.  He denies any recent surgeries, traumas or infections.  He denies any history of stroke or heart attack.  He denies any history of Crohn's disease or ulcerative colitis, COPD or asthma.  He denies any history of cancer.  He denies any varicosities to his lower extremities.  He denies any recent fevers, chills or changes in his health.  Summary of Previous Visit: Patient was seen for initial consult by Dr. Lowery on 06/27/2024.  At this visit, it was noted that patient had an area that was about 2 cm on his right forehead.  It was raised and felt like a combination of a firm area that may be an osteoma and a fatty area that may be a lipoma.  Patient reported to have been there for several years and that it was getting a little bit bigger.  Plan was for excision in the OR because it may have a bony component to it.  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  PMH Significant for: Varicocele, rotator cuff tear, mass of the skull   Past Medical History: Allergies: No Known Allergies  Current Medications:  Current Outpatient  Medications:    cephALEXin  (KEFLEX ) 500 MG capsule, Take 1 capsule (500 mg total) by mouth 4 (four) times daily for 3 days., Disp: 12 capsule, Rfl: 0   ondansetron  (ZOFRAN ) 4 MG tablet, Take 1 tablet (4 mg total) by mouth every 8 (eight) hours as needed for up to 10 doses for nausea or vomiting., Disp: 10 tablet, Rfl: 0   zolpidem  (AMBIEN ) 5 MG tablet, Take 1 tablet (5 mg total) by mouth at bedtime as needed for sleep., Disp: 10 tablet, Rfl: 0  Past Medical Problems: Past Medical History:  Diagnosis Date   Chest pain    Hyperlipidemia     Past Surgical History: Past Surgical History:  Procedure Laterality Date   NASAL/SINUS ENDOSCOPY  2011   varicose ligation   1993    Social History: Social History   Socioeconomic History   Marital status: Married    Spouse name: Not on file   Number of children: Not on file   Years of education: Not on file   Highest education level: Bachelor's degree (e.g., BA, AB, BS)  Occupational History   Not on file  Tobacco Use   Smoking status: Former   Smokeless tobacco: Never  Vaping Use   Vaping status: Never Used  Substance and Sexual Activity   Alcohol use: Never  Alcohol/week: 0.0 standard drinks of alcohol   Drug use: Never   Sexual activity: Yes  Other Topics Concern   Not on file  Social History Narrative   Not on file   Social Drivers of Health   Financial Resource Strain: Low Risk  (11/03/2023)   Overall Financial Resource Strain (CARDIA)    Difficulty of Paying Living Expenses: Not very hard  Food Insecurity: No Food Insecurity (11/03/2023)   Hunger Vital Sign    Worried About Running Out of Food in the Last Year: Never true    Ran Out of Food in the Last Year: Never true  Transportation Needs: No Transportation Needs (11/03/2023)   PRAPARE - Administrator, Civil Service (Medical): No    Lack of Transportation (Non-Medical): No  Physical Activity: Sufficiently Active (11/03/2023)   Exercise Vital Sign    Days  of Exercise per Week: 5 days    Minutes of Exercise per Session: 50 min  Stress: No Stress Concern Present (11/03/2023)   [employer] of Occupational Health - Occupational Stress Questionnaire    Feeling of Stress : Not at all  Social Connections: Socially Integrated (11/03/2023)   Social Connection and Isolation Panel    Frequency of Communication with Friends and Family: Once a week    Frequency of Social Gatherings with Friends and Family: Three times a week    Attends Religious Services: More than 4 times per year    Active Member of Clubs or Organizations: Yes    Attends Banker Meetings: More than 4 times per year    Marital Status: Married  Catering Manager Violence: Unknown (12/25/2022)   Received from [facility]   HITS    Physically Hurt: Not on file    Insult or Talk Down To: Not on file    Threaten Physical Harm: Not on file    Scream or Curse: Not on file    Family History: Family History  Problem Relation Age of Onset   Diabetes Father    Hyperlipidemia Father    Alcohol abuse Paternal Grandmother    Early death Paternal Grandfather    Heart disease Paternal Grandfather     Review of Systems: Denies any recent fevers, chills or changes in his health  Physical Exam: Vital Signs BP 122/81 (BP Location: Left Arm, Patient Position: Sitting, Cuff Size: Large)   Pulse (!) 57   Ht 6' 4 (1.93 m)   Wt 198 lb (89.8 kg)   SpO2 99%   BMI 24.10 kg/m   Physical Exam  Constitutional:      General: Not in acute distress.    Appearance: Normal appearance. Not ill-appearing.  HENT:     Head: Normocephalic and atraumatic.  Mass to right forehead noted Eyes:     Pupils: Pupils are equal, round Neck:     Musculoskeletal: Normal range of motion.  Cardiovascular:     Rate and Rhythm: Normal rate Pulmonary:     Effort: Pulmonary effort is normal. No respiratory distress.  Musculoskeletal: Normal range of motion.  Skin:    General: Skin is warm and  dry.     Findings: No erythema or rash.  Neurological:     Mental Status: Alert and oriented to person, place, and time. Mental status is at baseline.  Psychiatric:        Mood and Affect: Mood normal.        Behavior: Behavior normal.    Assessment/Plan: The patient is scheduled for  excision of forehead mass with Dr. Lowery.  Risks, benefits, and alternatives of procedure discussed, questions answered and consent obtained.    Will send clearance to patient's cardiologist.  Clearance form was given to clinical staff to fax off.  Smoking Status: Non-smoker; Counseling Given?  N/A  Caprini Score: 3; Risk Factors include: Age and length of planned surgery. Recommendation for mechanical prophylaxis. Encourage early ambulation.   Pictures obtained: @consult   Post-op Rx sent to pharmacy: Keflex , Zofran -patient declined pain medication at this time.  I discussed with him he may take Tylenol and ibuprofen for his pain postoperatively.  Patient expressed understanding.  Instructed the patient to hold any multivitamins or supplements at least 1 week prior to surgery.  Discussed with the patient that if he were to eventually need pain medications, he should not take this at the same time as his Ambien  as they can be sedating.  Patient expressed understanding.  Patient was provided with the General Surgical Risk consent document and Pain Medication Agreement prior to their appointment.  They had adequate time to read through the risk consent documents and Pain Medication Agreement. We also discussed them in person together during this preop appointment. All of their questions were answered to their satisfaction.  Recommended calling if they have any further questions.  Risk consent form and Pain Medication Agreement to be scanned into patient's chart.  The consent was obtained with risks and complications reviewed which included bleeding, pain, scar, infection and the risk of anesthesia.  The  patients questions were answered to the patients expressed satisfaction.   Risks and benefits of the surgery were discussed with the patient.  I discussed with the patient that it cannot be guaranteed to know what the mass exactly is until it is sent to pathology.  Patient expressed understanding of this.   Electronically signed by: Estefana FORBES Peck, PA-C 09/15/2024 11:40 AM

## 2024-09-15 NOTE — Telephone Encounter (Signed)
 Pt has been scheduled tele preop appt 09/23/24. Med rec and consent are done.

## 2024-09-15 NOTE — Telephone Encounter (Signed)
   Pre-operative Risk Assessment    Patient Name: Marvin Chan  DOB: [birth year] MRN: [id number]   Date of last office visit: 06/04/24 with Dr. Court Date of next office visit: NA  Request for Surgical Clearance    Procedure:  Excision of forehead mass  Date of Surgery:  Clearance 09/29/24                                 Surgeon:  Dr. Lowery [place] Group or Practice Name:  [facility]  Phone number:  [phone] Fax number:  [phone]   Type of Clearance Requested:   - Medical    Type of Anesthesia:  General    Additional requests/questions:    SignedAugustin JONETTA Daring   09/15/2024, 2:27 PM

## 2024-09-17 ENCOUNTER — Encounter (INDEPENDENT_AMBULATORY_CARE_PROVIDER_SITE_OTHER): Payer: Self-pay

## 2024-09-19 NOTE — Telephone Encounter (Signed)
 Patient canceled Pre-op tele-visit and stated surgery has been canceled.

## 2024-09-23 ENCOUNTER — Ambulatory Visit

## 2024-09-29 ENCOUNTER — Ambulatory Visit: Admit: 2024-09-29 | Admitting: Plastic Surgery

## 2024-09-29 SURGERY — EXCISION, MASS, HEAD
Anesthesia: Choice | Site: Head

## 2024-10-08 ENCOUNTER — Encounter: Admitting: Physician Assistant

## 2024-10-21 ENCOUNTER — Encounter: Admitting: Plastic Surgery

## 2024-11-05 ENCOUNTER — Encounter: Admitting: Student

## 2024-12-03 ENCOUNTER — Ambulatory Visit: Admitting: Family Medicine
